# Patient Record
Sex: Male | Born: 1945 | Race: White | Hispanic: No | Smoking: Never smoker
Health system: Southern US, Community
[De-identification: ages and names within clinical notes are randomized; demographics above are authoritative.]

## PROBLEM LIST (undated history)

## (undated) DIAGNOSIS — F0390 Unspecified dementia without behavioral disturbance: Secondary | ICD-10-CM

## (undated) DIAGNOSIS — I10 Essential (primary) hypertension: Secondary | ICD-10-CM

## (undated) HISTORY — PX: NO PAST SURGERIES: SHX2092

---

## 2021-04-08 ENCOUNTER — Emergency Department: Payer: Medicare Other

## 2021-04-08 ENCOUNTER — Other Ambulatory Visit: Payer: Self-pay

## 2021-04-08 ENCOUNTER — Inpatient Hospital Stay
Admission: EM | Admit: 2021-04-08 | Discharge: 2021-04-20 | DRG: 689 | Disposition: A | Discharge status: Skilled Nursing Facility | Payer: Medicare Other | Source: Skilled Nursing Facility | Attending: Internal Medicine | Admitting: Internal Medicine

## 2021-04-08 DIAGNOSIS — U071 COVID-19: Principal | ICD-10-CM | POA: Diagnosis present

## 2021-04-08 DIAGNOSIS — Z9181 History of falling: Secondary | ICD-10-CM

## 2021-04-08 DIAGNOSIS — Z20822 Contact with and (suspected) exposure to covid-19: Secondary | ICD-10-CM | POA: Diagnosis present

## 2021-04-08 DIAGNOSIS — G3183 Dementia with Lewy bodies: Secondary | ICD-10-CM | POA: Diagnosis present

## 2021-04-08 DIAGNOSIS — I4891 Unspecified atrial fibrillation: Secondary | ICD-10-CM | POA: Diagnosis present

## 2021-04-08 DIAGNOSIS — G20A1 Parkinson's disease without dyskinesia, without mention of fluctuations: Secondary | ICD-10-CM | POA: Diagnosis present

## 2021-04-08 DIAGNOSIS — B964 Proteus (mirabilis) (morganii) as the cause of diseases classified elsewhere: Secondary | ICD-10-CM | POA: Diagnosis present

## 2021-04-08 DIAGNOSIS — I10 Essential (primary) hypertension: Secondary | ICD-10-CM | POA: Diagnosis present

## 2021-04-08 DIAGNOSIS — R531 Weakness: Secondary | ICD-10-CM | POA: Diagnosis present

## 2021-04-08 DIAGNOSIS — G20 Parkinson's disease: Secondary | ICD-10-CM | POA: Diagnosis present

## 2021-04-08 DIAGNOSIS — K5900 Constipation, unspecified: Secondary | ICD-10-CM | POA: Diagnosis present

## 2021-04-08 DIAGNOSIS — Z79899 Other long term (current) drug therapy: Secondary | ICD-10-CM

## 2021-04-08 DIAGNOSIS — N3000 Acute cystitis without hematuria: Secondary | ICD-10-CM

## 2021-04-08 DIAGNOSIS — F0280 Dementia in other diseases classified elsewhere without behavioral disturbance: Secondary | ICD-10-CM | POA: Diagnosis present

## 2021-04-08 DIAGNOSIS — N390 Urinary tract infection, site not specified: Principal | ICD-10-CM | POA: Diagnosis present

## 2021-04-08 HISTORY — DX: Essential (primary) hypertension: I10

## 2021-04-08 HISTORY — DX: Unspecified dementia, unspecified severity, without behavioral disturbance, psychotic disturbance, mood disturbance, and anxiety: F03.90

## 2021-04-08 LAB — URINALYSIS, COMPLETE (UACMP) WITH MICROSCOPIC
Bilirubin Urine: NEGATIVE
Glucose, UA: NEGATIVE mg/dL
Ketones, ur: 20 mg/dL — AB
Nitrite: POSITIVE — AB
Protein, ur: NEGATIVE mg/dL
RBC / HPF: >50 RBC/hpf — ABNORMAL HIGH (ref 0–5)
Specific Gravity, Urine: 1.016 (ref 1.005–1.030)
Squamous Epithelial / HPF: NONE SEEN (ref 0–5)
WBC, UA: >50 WBC/hpf — ABNORMAL HIGH (ref 0–5)
pH: 8 (ref 5.0–8.0)

## 2021-04-08 LAB — COMPREHENSIVE METABOLIC PANEL
ALT: 13 U/L (ref 0–44)
AST: 18 U/L (ref 15–41)
Albumin: 4.2 g/dL (ref 3.5–5.0)
Alkaline Phosphatase: 93 U/L (ref 38–126)
Anion gap: 9 (ref 5–15)
BUN: 17 mg/dL (ref 8–23)
CO2: 24 mmol/L (ref 22–32)
Calcium: 9.1 mg/dL (ref 8.9–10.3)
Chloride: 108 mmol/L (ref 98–111)
Creatinine, Ser: 0.98 mg/dL (ref 0.61–1.24)
GFR, Estimated: >60 mL/min (ref >60)
Glucose, Bld: 99 mg/dL (ref 70–99)
Potassium: 3.9 mmol/L (ref 3.5–5.1)
Sodium: 141 mmol/L (ref 135–145)
Total Bilirubin: 1.5 mg/dL — ABNORMAL HIGH (ref 0.3–1.2)
Total Protein: 7.1 g/dL (ref 6.5–8.1)

## 2021-04-08 LAB — RESP PANEL BY RT-PCR (FLU A&B, COVID) ARPGX2
Influenza A by PCR: NEGATIVE
Influenza B by PCR: NEGATIVE
SARS Coronavirus 2 by RT PCR: POSITIVE — AB

## 2021-04-08 LAB — CBC WITH DIFFERENTIAL/PLATELET
Abs Immature Granulocytes: 0.02 10*3/uL (ref 0.00–0.07)
Basophils Absolute: 0 10*3/uL (ref 0.0–0.1)
Basophils Relative: 1 %
Eosinophils Absolute: 0.2 10*3/uL (ref 0.0–0.5)
Eosinophils Relative: 2 %
HCT: 44.2 % (ref 39.0–52.0)
Hemoglobin: 15.4 g/dL (ref 13.0–17.0)
Immature Granulocytes: 0 %
Lymphocytes Relative: 7 %
Lymphs Abs: 0.6 10*3/uL — ABNORMAL LOW (ref 0.7–4.0)
MCH: 32.4 pg (ref 26.0–34.0)
MCHC: 34.8 g/dL (ref 30.0–36.0)
MCV: 93.1 fL (ref 80.0–100.0)
Monocytes Absolute: 0.7 10*3/uL (ref 0.1–1.0)
Monocytes Relative: 8 %
Neutro Abs: 6.6 10*3/uL (ref 1.7–7.7)
Neutrophils Relative %: 82 %
Platelets: 160 10*3/uL (ref 150–400)
RBC: 4.75 MIL/uL (ref 4.22–5.81)
RDW: 12.6 % (ref 11.5–15.5)
WBC: 8 10*3/uL (ref 4.0–10.5)
nRBC: 0 % (ref 0.0–0.2)

## 2021-04-08 LAB — MRSA PCR SCREENING: MRSA by PCR: NEGATIVE

## 2021-04-08 LAB — TROPONIN I (HIGH SENSITIVITY): Troponin I (High Sensitivity): 7 ng/L (ref <18)

## 2021-04-08 MED ORDER — POLYVINYL ALCOHOL 1.4 % OP SOLN
1.0000 [drp] | Freq: Two times a day (BID) | OPHTHALMIC | Status: DC
  Administered 2021-04-08 – 2021-04-20 (×24): 1 [drp] via OPHTHALMIC
  Filled 2021-04-08: qty 15

## 2021-04-08 MED ORDER — SODIUM CHLORIDE 0.9 % IV SOLN
1.0000 g | INTRAVENOUS | Status: DC
  Administered 2021-04-09 – 2021-04-11 (×3): 1 g via INTRAVENOUS
  Filled 2021-04-08: qty 1
  Filled 2021-04-08 (×3): qty 10

## 2021-04-08 MED ORDER — ACETAMINOPHEN 325 MG PO TABS: 650.0000 mg | ORAL_TABLET | Freq: Four times a day (QID) | ORAL | Status: DC | PRN

## 2021-04-08 MED ORDER — ONDANSETRON HCL 4 MG PO TABS: 4.0000 mg | ORAL_TABLET | Freq: Four times a day (QID) | ORAL | Status: DC | PRN

## 2021-04-08 MED ORDER — ESCITALOPRAM OXALATE 10 MG PO TABS
20.0000 mg | ORAL_TABLET | Freq: Every day | ORAL | Status: DC
  Administered 2021-04-09 – 2021-04-20 (×12): 20 mg via ORAL
  Filled 2021-04-08 (×12): qty 2

## 2021-04-08 MED ORDER — SODIUM CHLORIDE 0.9 % IV BOLUS
500.0000 mL | Freq: Once | INTRAVENOUS | Status: AC
  Administered 2021-04-08: 500 mL via INTRAVENOUS

## 2021-04-08 MED ORDER — SODIUM CHLORIDE 0.9% FLUSH: 3.0000 mL | INTRAVENOUS | Status: DC | PRN

## 2021-04-08 MED ORDER — VITAMIN D (ERGOCALCIFEROL) 1.25 MG (50000 UNIT) PO CAPS
50000.0000 [IU] | ORAL_CAPSULE | ORAL | Status: DC
  Administered 2021-04-09 – 2021-04-16 (×2): 50000 [IU] via ORAL
  Filled 2021-04-08 (×2): qty 1

## 2021-04-08 MED ORDER — SODIUM CHLORIDE 0.9 % IV SOLN: 250.0000 mL | INTRAVENOUS | Status: DC | PRN

## 2021-04-08 MED ORDER — MIRTAZAPINE 15 MG PO TABS
30.0000 mg | ORAL_TABLET | Freq: Every day | ORAL | Status: DC
  Administered 2021-04-09 – 2021-04-19 (×11): 30 mg via ORAL
  Filled 2021-04-08 (×11): qty 2

## 2021-04-08 MED ORDER — SODIUM CHLORIDE 0.9 % IV SOLN
1.0000 g | Freq: Once | INTRAVENOUS | Status: AC
  Administered 2021-04-08: 1 g via INTRAVENOUS
  Filled 2021-04-08: qty 10

## 2021-04-08 MED ORDER — SODIUM CHLORIDE 0.9% FLUSH
3.0000 mL | Freq: Two times a day (BID) | INTRAVENOUS | Status: DC
  Administered 2021-04-08 – 2021-04-20 (×24): 3 mL via INTRAVENOUS

## 2021-04-08 MED ORDER — ONDANSETRON HCL 4 MG/2ML IJ SOLN: 4.0000 mg | Freq: Four times a day (QID) | INTRAMUSCULAR | Status: DC | PRN

## 2021-04-08 MED ORDER — CARBIDOPA-LEVODOPA 25-100 MG PO TABS
1.0000 | ORAL_TABLET | Freq: Three times a day (TID) | ORAL | Status: DC
  Administered 2021-04-08 – 2021-04-20 (×36): 1 via ORAL
  Filled 2021-04-08 (×36): qty 1

## 2021-04-08 MED ORDER — ENOXAPARIN SODIUM 40 MG/0.4ML ~~LOC~~ SOLN
40.0000 mg | SUBCUTANEOUS | Status: DC
  Administered 2021-04-08 – 2021-04-20 (×13): 40 mg via SUBCUTANEOUS
  Filled 2021-04-08 (×13): qty 0.4

## 2021-04-08 MED ORDER — RIVASTIGMINE 9.5 MG/24HR TD PT24
9.5000 mg | MEDICATED_PATCH | Freq: Every day | TRANSDERMAL | Status: DC
  Administered 2021-04-09 – 2021-04-20 (×12): 9.5 mg via TRANSDERMAL
  Filled 2021-04-08 (×12): qty 1

## 2021-04-08 MED ORDER — DILTIAZEM HCL ER COATED BEADS 120 MG PO CP24
120.0000 mg | ORAL_CAPSULE | Freq: Every day | ORAL | Status: DC
  Administered 2021-04-09 – 2021-04-10 (×2): 120 mg via ORAL
  Filled 2021-04-08 (×2): qty 1

## 2021-04-08 MED ORDER — ACETAMINOPHEN 650 MG RE SUPP: 650.0000 mg | Freq: Four times a day (QID) | RECTAL | Status: DC | PRN

## 2021-04-08 NOTE — ED Triage Notes (Addendum)
Pt to ED [facility] from [facility] for AMS x2 days.  Hx parkinsons, HTN Pt reports having fall, unsure of details, EMS did not receive info of fall with report.  Pt alert and oriented on arrival.  Hx of UTI Wheelchair use at home  MD Fuller Plan at bedside

## 2021-04-08 NOTE — H&P (Signed)
History and Physical    Justin HaroldHarold Holaday WUJ:[id number] DOB: 12/26/1946 DOA: 04/08/2021  PCP: Pcp, No   Patient coming from: [facility] assisted living facility  I have personally briefly reviewed patient's [facility] Most of the history was obtained from the ER notes and patient's brother-in-law at the bedside  Chief Complaint: Weakness  HPI: Justin Kim is a 75 y.o. male with medical history significant for Parkinson's disease with Lewy body dementia and hypertension who was brought to the ER by EMS for evaluation of weakness and disorientation for about 2 days.  Patient usually ambulates with a rolling walker has been too weak to do that.  No history of recent falls.  His brother-in-law was at the bedside states that he has difficulty with ambulation due to his Parkinson's disease. He denies having any cough, no fever, no chills, no diarrhea, no myalgias, no anorexia, no urinary frequency, no nocturia, no dysuria, no headache, no chest pain or shortness of breath, no palpitations, no diaphoresis, no nausea or vomiting. Labs show sodium 141, potassium 3.9, chloride 108, bicarb 24, glucose 99, BUN 17, creatinine 0.98, calcium 9.9, alkaline phosphatase 93, albumin 4.2, AST 18, ALT 13, total protein 7.1, total bilirubin 1.5, troponin VII, white count 8.0 with a left shift, hemoglobin 15.4, hematocrit 44.2, MCV 93.1, RDW 12.6, platelet count 160 His SARS coronavirus 2 point-of-care PCR test is positive. Urinalysis shows pyuria Cervical spine CT shows early degenerative changes in the cervical spine. Mild osteopenia. No acute bony abnormality. CT scan of the head without contrast shows atrophy, chronic microvascular disease. No acute intracranial abnormality. Chronic sinusitis. Twelve-lead EKG reviewed by me shows atrial fibrillation with nonspecific ST changes in the anterior lateral leads.    ED Course: Patient is a 75 year old male who presents to the ER by EMS for  evaluation of weakness and difficulty with ambulation.  Patient's point-of-care SARS coronavirus 2 PCR test is positive but he is asymptomatic.  He is vaccinated and resides in an assisted facility.  He has significant pyuria and will be admitted to the hospital for further evaluation.    Review of Systems: As per HPI otherwise all other systems reviewed and negative.    Past Medical History:  Diagnosis Date  . Dementia (HCC)   . Hypertension       reports previous alcohol use. He reports previous drug use. No history on file for tobacco use.  Not on File  Family History  Family history unknown: Yes      Prior to Admission medications   Medication Sig Start Date End Date Taking? Authorizing Provider  carbidopa-levodopa (SINEMET IR) 25-100 MG tablet Take 1 tablet by mouth 3 (three) times daily. 03/13/21  Yes [provider]  carboxymethylcellulose 1 % ophthalmic solution 1 drop 2 (two) times daily.   Yes [provider]  diltiazem (CARDIZEM CD) 120 MG 24 hr capsule Take 120 mg by mouth daily. 03/13/21  Yes [provider]  escitalopram (LEXAPRO) 20 MG tablet Take 1 tablet by mouth daily. 01/17/21  Yes [provider]  mirtazapine (REMERON) 30 MG tablet Take 30 mg by mouth at bedtime. 03/13/21  Yes [provider]  rivastigmine (EXELON) 9.5 mg/24hr 9.5 mg daily. 03/16/21  Yes [provider]  Vitamin D, Ergocalciferol, (DRISDOL) 1.25 MG (50000 UNIT) CAPS capsule Take 1 capsule by mouth once a week. 03/13/21   [provider]    Physical Exam: Vitals:   04/08/21 1025 04/08/21 1030 04/08/21 1130 04/08/21  1145  BP:  (!) 131/93    Pulse: (!) 114 95 99 (!) 102  Resp: 15 13 13  (!) 24  Temp:      TempSrc:      SpO2: 100% 100% 100% 98%  Weight:      Height:         Vitals:   04/08/21 1025 04/08/21 1030 04/08/21 1130 04/08/21 1145  BP:  (!) 131/93    Pulse: (!) 114 95 99 (!) 102  Resp: 15 13 13  (!) 24  Temp:       TempSrc:      SpO2: 100% 100% 100% 98%  Weight:      Height:          Constitutional: Alert and oriented x 1.  Only to person not to place or time . Not in any apparent distress HEENT:      Head: Normocephalic and atraumatic.         Eyes: PERLA, EOMI, Conjunctivae are normal. Sclera is non-icteric.       Mouth/Throat: Mucous membranes are moist.       Neck: Supple with no signs of meningismus. Cardiovascular:  Irregularly irregular. No murmurs, gallops, or rubs. 2+ symmetrical distal pulses are present . No JVD. No LE edema Respiratory: Respiratory effort normal .Lungs sounds clear bilaterally. No wheezes, crackles, or rhonchi.  Gastrointestinal: Soft, non tender, and non distended with positive bowel sounds.  Genitourinary: No CVA tenderness. Musculoskeletal: Nontender with normal range of motion in all extremities. No cyanosis, or erythema of extremities. Neurologic:  Face is symmetric. Moving all extremities. No gross focal neurologic deficits.  Generalized tremors Skin: Skin is warm, dry.  No rash or ulcers Psychiatric: Mood and affect are normal   Labs on Admission: I have personally reviewed following labs and imaging studies  CBC: Recent Labs  Lab 04/08/21 0817  WBC 8.0  NEUTROABS 6.6  HGB 15.4  HCT 44.2  MCV 93.1  PLT 160   Basic Metabolic Panel: Recent Labs  Lab 04/08/21 0817  NA 141  K 3.9  CL 108  CO2 24  GLUCOSE 99  BUN 17  CREATININE 0.98  CALCIUM 9.1   GFR: Estimated Creatinine Clearance: 69.4 mL/min (by C-G formula based on SCr of 0.98 mg/dL). Liver Function Tests: Recent Labs  Lab 04/08/21 0817  AST 18  ALT 13  ALKPHOS 93  BILITOT 1.5*  PROT 7.1  ALBUMIN 4.2   No results for input(s): LIPASE, AMYLASE in the last 168 hours. No results for input(s): AMMONIA in the last 168 hours. Coagulation Profile: No results for input(s): INR, PROTIME in the last 168 hours. Cardiac Enzymes: No results for input(s): CKTOTAL, CKMB, CKMBINDEX,  TROPONINI in the last 168 hours. BNP (last 3 results) No results for input(s): PROBNP in the last 8760 hours. HbA1C: No results for input(s): HGBA1C in the last 72 hours. CBG: No results for input(s): GLUCAP in the last 168 hours. Lipid Profile: No results for input(s): CHOL, HDL, LDLCALC, TRIG, CHOLHDL, LDLDIRECT in the last 72 hours. Thyroid Function Tests: No results for input(s): TSH, T4TOTAL, FREET4, T3FREE, THYROIDAB in the last 72 hours. Anemia Panel: No results for input(s): VITAMINB12, FOLATE, FERRITIN, TIBC, IRON, RETICCTPCT in the last 72 hours. Urine analysis:    Component Value Date/Time   COLORURINE YELLOW (A) 04/08/2021 0818   APPEARANCEUR CLOUDY (A) 04/08/2021 0818   LABSPEC 1.016 04/08/2021 0818   PHURINE 8.0 04/08/2021 0818   GLUCOSEU NEGATIVE 04/08/2021 0818   HGBUR MODERATE (A)  04/08/2021 0818   BILIRUBINUR NEGATIVE 04/08/2021 0818   KETONESUR 20 (A) 04/08/2021 0818   PROTEINUR NEGATIVE 04/08/2021 0818   NITRITE POSITIVE (A) 04/08/2021 0818   LEUKOCYTESUR LARGE (A) 04/08/2021 0818    Radiological Exams on Admission: DG Chest 2 View  Result Date: 04/08/2021 CLINICAL DATA:  Pt to ED [facility] from [facility] for AMS x2 days. Hx parkinsons, HTNPt reports having fall, unsure of details, EMS did not receive info of fall with report. Pt alert and oriented on arrival. Hx of UTIWheelchair use at home EXAM: CHEST - 2 VIEW COMPARISON:  None. FINDINGS: Cardiac silhouette top-normal in size. No mediastinal or hilar masses. No evidence of adenopathy. Clear lungs.  No pleural effusion or pneumothorax. Skeletal structures are demineralized but intact. IMPRESSION: No active cardiopulmonary disease. Electronically Signed   By: Amie Portland M.D.   On: 04/08/2021 08:39   CT Head Wo Contrast  Result Date: 04/08/2021 CLINICAL DATA:  Altered mental status.  Fall. EXAM: CT HEAD WITHOUT CONTRAST TECHNIQUE: Contiguous axial images were obtained from the base of the skull through the  vertex without intravenous contrast. COMPARISON:  None. FINDINGS: Brain: There is atrophy and chronic small vessel disease changes. No acute intracranial abnormality. Specifically, no hemorrhage, hydrocephalus, mass lesion, acute infarction, or significant intracranial injury. Vascular: No hyperdense vessel or unexpected calcification. Skull: No acute calvarial abnormality. Sinuses/Orbits: Mucosal thickening in scattered ethmoid air cells and the left maxillary sinus. No air-fluid levels. Other: None IMPRESSION: Atrophy, chronic microvascular disease. No acute intracranial abnormality. Chronic sinusitis. Electronically Signed   By: Charlett Nose M.D.   On: 04/08/2021 08:56   CT Cervical Spine Wo Contrast  Result Date: 04/08/2021 CLINICAL DATA:  Altered mental status.  Fall. EXAM: CT CERVICAL SPINE WITHOUT CONTRAST TECHNIQUE: Multidetector CT imaging of the cervical spine was performed without intravenous contrast. Multiplanar CT image reconstructions were also generated. COMPARISON:  None. FINDINGS: Alignment: No subluxation. Skull base and vertebrae: No acute fracture.  Mild osteopenia. Soft tissues and spinal canal: No prevertebral fluid or swelling. No visible canal hematoma. Disc levels: Early degenerative disc disease in the mid to lower cervical spine with early disc space narrowing and anterior spurring. Mild bilateral degenerative facet disease. Upper chest: No acute findings. Other: None IMPRESSION: Early degenerative changes in the cervical spine. Mild osteopenia. No acute bony abnormality. Electronically Signed   By: Charlett Nose M.D.   On: 04/08/2021 08:58     Assessment/Plan Principal Problem:   Generalized weakness Active Problems:   Acute lower UTI   Dementia due to Parkinson's disease without behavioral disturbance (HCC)   Parkinson disease (HCC)   Unspecified atrial fibrillation (HCC)   COVID-19 virus detected     Generalized weakness Appears to be multifactorial and related to  Parkinson's disease as well as recent UTI. We will place patient on fall precautions PT evaluation    UTI Patient has significant pyuria We will treat empirically with Rocephin 1 g IV daily until urine culture results become available    Parkinson's disease with dementia without behavioral disturbance Continue Sinemet Continue Exelon and Remeron    History of A. Fib Continue diltiazem for rate control Patient is noted anticoagulation due to increased risk of falls    COVID-19 virus detected Patient had a screening test on admission and tested positive for COVID-19 virus He is vaccinated He is currently asymptomatic We will monitor closely during this hospitalization   DVT prophylaxis: Lovenox Code Status: full code Family Communication: Greater than 50% of time  was spent discussing patient's condition and plan of care with his brother-in-law at the bedside.  All questions and concerns have been addressed.  He verbalizes understanding and agrees to the plan. Disposition Plan: Back to previous home environment Consults called: Physical therapy Status: At time of admission, it appears that the appropriate admission status for this patient is inpatient. This is judged to be reasonable and necessary in order to provide the required intensity of service to ensure the patient's safety given the presenting symptoms, physical exam findings and initial radiographic and laboratory data in the context of their comorbid conditions. Patient requires inpatient status due to high intensity of service, high risk for further deterioration and high frequency of surveillance required.    Lucile Shutters MD [facility]     04/08/2021, 12:37 PM

## 2021-04-08 NOTE — ED Notes (Signed)
Spoke to [place] at [facility], informed of pt covid +.  Reports that pt usually uses walker but was unable to do so today d/t weakness and disorientation

## 2021-04-08 NOTE — ED Notes (Signed)
Pt to CT

## 2021-04-08 NOTE — ED Notes (Signed)
Dr Fuller Plan notified of positive covid. Orders to be placed as needed.

## 2021-04-08 NOTE — Evaluation (Signed)
Physical Therapy Evaluation Patient Details Name: Justin Kim MRN: [id number] DOB: 1946/01/28 Today's Date: 04/08/2021   History of Present Illness  Pt admitted for generalized weakness with AMS x 2 days. History includes Parkinson's dx, HTN, and lewy body dementia.  Clinical Impression  Pt is a pleasant 75 year old male who was admitted for generalized weakness and now is covid +. Pt performs bed mobility with max assist. Needed to place bed in trendelenburg position for repositioning once in bed. Two attempts to stand with poor weight shift and max assist. Unable to stand with or without AD. Pt demonstrates deficits with cognition/mobility/strength. Per EMR, baseline is minimal ambulation/transfers to WC. DOesn't appear to be at baseline level at this time. Would benefit from skilled PT to address above deficits and promote optimal return to PLOF; recommend transition to STR upon discharge from acute hospitalization.     Follow Up Recommendations SNF    Equipment Recommendations  None recommended by PT    Recommendations for Other Services       Precautions / Restrictions Precautions Precautions: Fall Restrictions Weight Bearing Restrictions: No      Mobility  Bed Mobility Overal bed mobility: Needs Assistance Bed Mobility: Supine to Sit     Supine to sit: Max assist     General bed mobility comments: needs max assist to sit at EOB, with post leaning noted. Once seated at EOB, L lateral leaning needing min assist for correction. Able to sit for extended time and perform ther-ex    Transfers                 General transfer comment: attempted x 2 with and without RW. Unable to demo ant weight shift and is resistive to manual cues. Unable to lift buttocks  Ambulation/Gait             General Gait Details: not able to  perform  Stairs            Wheelchair Mobility    Modified Rankin (Stroke Patients Only)       Balance Overall balance  assessment: Needs assistance Sitting-balance support: Feet supported;Bilateral upper extremity supported Sitting balance-Leahy Scale: Fair Sitting balance - Comments: has difficulty maintaining upright posture. L lateral leaning noted                                     Pertinent Vitals/Pain Pain Assessment: No/denies pain    Home Living Family/patient expects to be discharged to:: Assisted living               Home Equipment: Walker - 2 wheels;Wheelchair - manual Additional Comments: lives at [facility]    Prior Function Level of Independence: Needs assistance         Comments: pt is poor historian. Reports he transfers with RW to [facility] with staff assistance and then self propels. Unsure of accuracy     Hand Dominance        Extremity/Trunk Assessment   Upper Extremity Assessment Upper Extremity Assessment: Generalized weakness (B UE grossly 3+/5 and limited ROM overhead)    Lower Extremity Assessment Lower Extremity Assessment: Generalized weakness;Difficult to assess due to impaired cognition (B LE grossly 2/5; resistive to movement)       Communication   Communication: No difficulties  Cognition Arousal/Alertness: Awake/alert Behavior During Therapy: Flat affect Overall Cognitive Status: History of cognitive impairments - at baseline  General Comments: pt alert and oriented to self. Pleasant. Takes increased time for task initiation      General Comments      Exercises Other Exercises Other Exercises: supine/seated ther-ex performed on B LE including AP, SLRs, hip abd/add, and LAQ. All ther-ex performed x 10 reps with mod assist and cues for task intiiation. Delayed response   Assessment/Plan    PT Assessment Patient needs continued PT services  PT Problem List Decreased strength;Decreased activity tolerance;Decreased balance;Decreased mobility;Decreased cognition       PT  Treatment Interventions Gait training;DME instruction;Therapeutic exercise;Balance training    PT Goals (Current goals can be found in the Care Plan section)  Acute Rehab PT Goals Patient Stated Goal: unable to state PT Goal Formulation: Patient unable to participate in goal setting Time For Goal Achievement: 04/22/21 Potential to Achieve Goals: Fair    Frequency Min 2X/week   Barriers to discharge        Co-evaluation               AM-PAC PT "6 Clicks" Mobility  Outcome Measure Help needed turning from your back to your side while in a flat bed without using bedrails?: A Lot Help needed moving from lying on your back to sitting on the side of a flat bed without using bedrails?: A Lot Help needed moving to and from a bed to a chair (including a wheelchair)?: Total Help needed standing up from a chair using your arms (e.g., wheelchair or bedside chair)?: Total Help needed to walk in hospital room?: Total Help needed climbing 3-5 steps with a railing? : Total 6 Click Score: 8    End of Session Equipment Utilized During Treatment: Gait belt Activity Tolerance: Patient tolerated treatment well Patient left: in bed;with bed alarm set Nurse Communication: Mobility status PT Visit Diagnosis: Muscle weakness (generalized) (M62.81);Difficulty in walking, not elsewhere classified (R26.2)    Time: 8250-0370 PT Time Calculation (min) (ACUTE ONLY): 27 min   Charges:   PT Evaluation $PT Eval Low Complexity: 1 Low PT Treatments $Therapeutic Exercise: 8-22 mins        Elizabeth Palau, PT, DPT [phone]   Kira Hartl 04/08/2021, 4:33 PM

## 2021-04-08 NOTE — ED Notes (Signed)
This tech and Caitlyn EDT attempted to ambulate pt with a walker. Pt was able to sit up on the side of the bed with assistance. Pt was unable to use walker to stand up and ambulate. Fuller Plan, MD is aware.

## 2021-04-08 NOTE — ED Notes (Signed)
This RN attempted to get in contact with [facility] to get baseline on pt, no answer

## 2021-04-08 NOTE — ED Provider Notes (Signed)
[facility] Emergency Department Provider Note  ____________________________________________   Event Date/Time   First MD Initiated Contact with Patient 04/08/21 0815     (approximate)  I have reviewed the triage vital signs and the nursing notes.   HISTORY  Chief Complaint Altered Mental Status    HPI Justin Kim is a 75 y.o. male with Parkinson's and hypertension who comes in for altered mental status.  Patient is coming from [facility] assisted living.  Patient has never been to our hospital previously.  Patient reports that he had a fall today.  Unclear if he hit his head.  Denies any pain anywhere.  According to EMS they were never told that patient had a fall.  They were told patient was coming in for altered mental status.  They were stating that he was just more weak than normal and he was not answering questions like he typically does.  Nurse was able to get a hold of the facility when patient first got here and they stated that patient normally walks with a walker but they were having difficulty eating standing him up today.  Patient's history is limited secondary to patient's baseline Parkinson's disease.      medical: Parkinson's, hypertension   There are no problems to display for this patient.  Prior to Admission medications   Not on File    Allergies Patient has no allergy information on record.  No family history on file.  Social History No alcohol or drug use   Review of Systems Constitutional: No fever/chills, weakness Eyes: No visual changes. ENT: No sore throat. Cardiovascular: Denies chest pain. Respiratory: Denies shortness of breath. Gastrointestinal: No abdominal pain.  No nausea, no vomiting.  No diarrhea.  No constipation. Genitourinary: Negative for dysuria. Musculoskeletal: Negative for back pain. Skin: Negative for rash. Neurological: Negative for headaches, focal weakness or numbness.  Confusion All other  ROS negative ____________________________________________   PHYSICAL EXAM:  VITAL SIGNS: Blood pressure (!) 131/93, pulse (!) 102, temperature 98.9 F (37.2 C), temperature source Oral, resp. rate (!) 24, height 5\' 11"  (1.803 m), weight 81.6 kg, SpO2 98 %.   Constitutional: Alert and oriented x3. Well appearing and in no acute distress. Eyes: Conjunctivae are normal. EOMI. Head: Atraumatic. Nose: No congestion/rhinnorhea. Mouth/Throat: Mucous membranes are moist.   Neck: No stridor. Trachea Midline. FROM Cardiovascular: Irregular, tachycardic grossly normal heart sounds.  Good peripheral circulation. Respiratory: Normal respiratory effort.  No retractions. Lungs CTAB. Gastrointestinal: Soft and nontender. No distention. No abdominal bruits.  Musculoskeletal: No lower extremity tenderness nor edema.  No joint effusions.  No evidence of tenderness in any of his extremities. Neurologic:  Normal speech and language. No gross focal neurologic deficits are appreciated.  Equal strength in arms and legs. Skin:  Skin is warm, dry and intact. No rash noted. Psychiatric: Mood and affect are normal. Speech and behavior are normal. GU: Deferred   ____________________________________________   LABS (all labs ordered are listed, but only abnormal results are displayed)  Labs Reviewed  RESP PANEL BY RT-PCR (FLU A&B, COVID) ARPGX2 - Abnormal; Notable for the following components:      Result Value   SARS Coronavirus 2 by RT PCR POSITIVE (*)    All other components within normal limits  CBC WITH DIFFERENTIAL/PLATELET - Abnormal; Notable for the following components:   Lymphs Abs 0.6 (*)    All other components within normal limits  COMPREHENSIVE METABOLIC PANEL - Abnormal; Notable for the following components:  Total Bilirubin 1.5 (*)    All other components within normal limits  URINALYSIS, COMPLETE (UACMP) WITH MICROSCOPIC - Abnormal; Notable for the following components:   Color, Urine  YELLOW (*)    APPearance CLOUDY (*)    Hgb urine dipstick MODERATE (*)    Ketones, ur 20 (*)    Nitrite POSITIVE (*)    Leukocytes,Ua LARGE (*)    RBC / HPF >50 (*)    WBC, UA >50 (*)    Bacteria, UA MANY (*)    All other components within normal limits  URINE CULTURE  TROPONIN I (HIGH SENSITIVITY)   ____________________________________________   ED ECG REPORT I, Concha Se, the attending physician, personally viewed and interpreted this ECG.  Atrial fibrillation rate of 107, no ST elevation, no T wave inversions, normal intervals ____________________________________________  RADIOLOGY Vela Prose, personally viewed and evaluated these images (plain radiographs) as part of my medical decision making, as well as reviewing the written report by the radiologist.  ED MD interpretation: No pneumonia  Official radiology report(s): DG Chest 2 View  Result Date: 04/08/2021 CLINICAL DATA:  Pt to ED [facility] from [facility] for AMS x2 days. Hx parkinsons, HTNPt reports having fall, unsure of details, EMS did not receive info of fall with report. Pt alert and oriented on arrival. Hx of UTIWheelchair use at home EXAM: CHEST - 2 VIEW COMPARISON:  None. FINDINGS: Cardiac silhouette top-normal in size. No mediastinal or hilar masses. No evidence of adenopathy. Clear lungs.  No pleural effusion or pneumothorax. Skeletal structures are demineralized but intact. IMPRESSION: No active cardiopulmonary disease. Electronically Signed   By: Amie Portland M.D.   On: 04/08/2021 08:39   CT Head Wo Contrast  Result Date: 04/08/2021 CLINICAL DATA:  Altered mental status.  Fall. EXAM: CT HEAD WITHOUT CONTRAST TECHNIQUE: Contiguous axial images were obtained from the base of the skull through the vertex without intravenous contrast. COMPARISON:  None. FINDINGS: Brain: There is atrophy and chronic small vessel disease changes. No acute intracranial abnormality. Specifically, no hemorrhage, hydrocephalus,  mass lesion, acute infarction, or significant intracranial injury. Vascular: No hyperdense vessel or unexpected calcification. Skull: No acute calvarial abnormality. Sinuses/Orbits: Mucosal thickening in scattered ethmoid air cells and the left maxillary sinus. No air-fluid levels. Other: None IMPRESSION: Atrophy, chronic microvascular disease. No acute intracranial abnormality. Chronic sinusitis. Electronically Signed   By: Charlett Nose M.D.   On: 04/08/2021 08:56   CT Cervical Spine Wo Contrast  Result Date: 04/08/2021 CLINICAL DATA:  Altered mental status.  Fall. EXAM: CT CERVICAL SPINE WITHOUT CONTRAST TECHNIQUE: Multidetector CT imaging of the cervical spine was performed without intravenous contrast. Multiplanar CT image reconstructions were also generated. COMPARISON:  None. FINDINGS: Alignment: No subluxation. Skull base and vertebrae: No acute fracture.  Mild osteopenia. Soft tissues and spinal canal: No prevertebral fluid or swelling. No visible canal hematoma. Disc levels: Early degenerative disc disease in the mid to lower cervical spine with early disc space narrowing and anterior spurring. Mild bilateral degenerative facet disease. Upper chest: No acute findings. Other: None IMPRESSION: Early degenerative changes in the cervical spine. Mild osteopenia. No acute bony abnormality. Electronically Signed   By: Charlett Nose M.D.   On: 04/08/2021 08:58    ____________________________________________   PROCEDURES  Procedure(s) performed (including Critical Care):  .1-3 Lead EKG Interpretation Performed by: Concha Se, MD Authorized by: Concha Se, MD     Interpretation: abnormal     ECG rate:  90-110s  ECG rate assessment: tachycardic     Rhythm: atrial fibrillation     Ectopy: none     Conduction: normal       ____________________________________________   INITIAL IMPRESSION / ASSESSMENT AND PLAN / ED COURSE  Justin Kim was evaluated in Emergency Department on  04/08/2021 for the symptoms described in the history of present illness. He was evaluated in the context of the global COVID-19 pandemic, which necessitated consideration that the patient might be at risk for infection with the SARS-CoV-2 virus that causes COVID-19. Institutional protocols and algorithms that pertain to the evaluation of patients at risk for COVID-19 are in a state of rapid change based on information released by regulatory bodies including the CDC and federal and state organizations. These policies and algorithms were followed during the patient's care in the ED.     Patient comes in with some confusion and weakness per staff.  Will get urine to evaluate for UTI, labs to evaluate for Electra MIs, AKI, ACS, Covid swab.  Patient reports having a fall. Will get a CT head and CT cervical to make sure no evidence of intracranial hemorrhage or cervical fracture.  Patient is in atrial fibrillation with rates less than 110 therefore is rate controlled but will give a little bit of fluid.    Patient's Covid swab is positive.  Urine is also positive for UTI.  Will send urine culture and give ceftriaxone.  Nurses attempted to get patient up and he was very weak.  Discussed with patient's son-in-law for admission given new weakness and I concerned that [facility] will not be able to take care of him given he is in an assisted living.  Initially son-in-law was hesitant of patient being admitted.  I made multiple attempts to try to get a hold of [facility] again to see if they felt comfortable with him going home even though he is now pretty much bedbound but we have not been able to get a hold of them.  Family has decided that they are okay with admitting patient due to new weakness   ____________________________________________   FINAL CLINICAL IMPRESSION(S) / ED DIAGNOSES   Final diagnoses:  COVID-19 virus detected  Acute cystitis without hematuria  Weakness  Atrial fibrillation,  unspecified type (HCC)      MEDICATIONS GIVEN DURING THIS VISIT:  Medications  sodium chloride 0.9 % bolus 500 mL (0 mLs Intravenous Stopped 04/08/21 1008)  cefTRIAXone (ROCEPHIN) 1 g in sodium chloride 0.9 % 100 mL IVPB (0 g Intravenous Stopped 04/08/21 1148)     ED Discharge Orders    None       Note:  This document was prepared using Dragon voice recognition software and may include unintentional dictation errors.   Concha Se, MD 04/08/21 [phone]

## 2021-04-08 NOTE — ED Notes (Signed)
Pt changed of urine incontinence. In and out performed with Caitlyn NT

## 2021-04-08 NOTE — ED Notes (Signed)
Attempted to call [facility] x2. Left message with [employer] operational officer for RN to return call

## 2021-04-09 ENCOUNTER — Encounter: Payer: Self-pay | Admitting: Internal Medicine

## 2021-04-09 DIAGNOSIS — R531 Weakness: Secondary | ICD-10-CM | POA: Diagnosis not present

## 2021-04-09 LAB — CBC
HCT: 40 % (ref 39.0–52.0)
Hemoglobin: 13.9 g/dL (ref 13.0–17.0)
MCH: 32.5 pg (ref 26.0–34.0)
MCHC: 34.8 g/dL (ref 30.0–36.0)
MCV: 93.5 fL (ref 80.0–100.0)
Platelets: 145 10*3/uL — ABNORMAL LOW (ref 150–400)
RBC: 4.28 MIL/uL (ref 4.22–5.81)
RDW: 12.5 % (ref 11.5–15.5)
WBC: 5.6 10*3/uL (ref 4.0–10.5)
nRBC: 0 % (ref 0.0–0.2)

## 2021-04-09 LAB — BASIC METABOLIC PANEL
Anion gap: 9 (ref 5–15)
BUN: 15 mg/dL (ref 8–23)
CO2: 24 mmol/L (ref 22–32)
Calcium: 8.7 mg/dL — ABNORMAL LOW (ref 8.9–10.3)
Chloride: 107 mmol/L (ref 98–111)
Creatinine, Ser: 0.92 mg/dL (ref 0.61–1.24)
GFR, Estimated: >60 mL/min (ref >60)
Glucose, Bld: 95 mg/dL (ref 70–99)
Potassium: 4 mmol/L (ref 3.5–5.1)
Sodium: 140 mmol/L (ref 135–145)

## 2021-04-09 MED ORDER — ADULT MULTIVITAMIN W/MINERALS CH
1.0000 | ORAL_TABLET | Freq: Every day | ORAL | Status: DC
  Administered 2021-04-09 – 2021-04-20 (×12): 1 via ORAL
  Filled 2021-04-09 (×12): qty 1

## 2021-04-09 MED ORDER — SODIUM CHLORIDE 0.9 % IV SOLN: INTRAVENOUS | Status: DC | PRN

## 2021-04-09 MED ORDER — PROSOURCE PLUS PO LIQD
30.0000 mL | Freq: Two times a day (BID) | ORAL | Status: DC
  Administered 2021-04-09 – 2021-04-19 (×21): 30 mL via ORAL
  Filled 2021-04-09 (×24): qty 30

## 2021-04-09 NOTE — Evaluation (Signed)
Occupational Therapy Evaluation Patient Details Name: Justin Kim MRN: [id number] DOB: 04/27/1946 Today's Date: 04/09/2021    History of Present Illness Pt admitted for generalized weakness with AMS x 2 days. History includes Parkinson's dx, HTN, and lewy body dementia.   Clinical Impression   Mr Justin Kim was seen for OT evaluation this date. Prior to hospital admission, pt was MOD I for mobility and ADLs. Pt lives at [facility]. Pt presents to acute OT demonstrating impaired ADL performance and functional mobility 2/2 decreased safety awareness, poor activity tolerance, and functional strength/ROM/balance deficits. Pt currently requires MOD A exit bed, MAX A return to bed. CGA static sitting balnace with BUE support decreasing to MIN A 2/2 L lateral lean as pt fatigue. MAX A for lateral scoot t/f, unable to clear rear for STS. Pt would benefit from skilled OT to address noted impairments and functional limitations (see below for any additional details) in order to maximize safety and independence while minimizing falls risk and caregiver burden. Upon hospital discharge, recommend STR to maximize pt safety and return to PLOF.     Follow Up Recommendations  SNF    Equipment Recommendations  None recommended by OT    Recommendations for Other Services       Precautions / Restrictions Precautions Precautions: Fall Restrictions Weight Bearing Restrictions: No      Mobility Bed Mobility Overal bed mobility: Needs Assistance Bed Mobility: Supine to Sit;Sit to Supine     Supine to sit: Mod assist Sit to supine: Max assist        Transfers Overall transfer level: Needs assistance   Transfers: Lateral/Scoot Transfers          Lateral/Scoot Transfers: Max assist General transfer comment: unable to clear rear for STS. MAX A for lateral scoot at EOB    Balance Overall balance assessment: Needs assistance Sitting-balance support: Feet supported;Bilateral upper extremity  supported Sitting balance-Leahy Scale: Fair Sitting balance - Comments: has difficulty maintaining upright posture. L lateral leaning noted                                   ADL either performed or assessed with clinical judgement   ADL Overall ADL's : Needs assistance/impaired                                       General ADL Comments: CGA static sitting balnace with BUE support decreasing to MIN A 2/2 L lateral lean as pt fatigue.                  Pertinent Vitals/Pain Pain Assessment: No/denies pain     Hand Dominance     Extremity/Trunk Assessment Upper Extremity Assessment Upper Extremity Assessment: Generalized weakness   Lower Extremity Assessment Lower Extremity Assessment: Generalized weakness       Communication Communication Communication: No difficulties   Cognition Arousal/Alertness: Awake/alert Behavior During Therapy: Flat affect Overall Cognitive Status: History of cognitive impairments - at baseline                                 General Comments: pt alert and oriented to self. Pleasant. Takes increased time for task initiation   General Comments       Exercises Exercises: Other exercises Other Exercises Other  Exercises: Pt educated re; OT role, DME recs, d/c recs, falls prevention, HEP Other Exercises: LBD, Sup<>sit, sitting balance/tolerance, lateral scoot   Shoulder Instructions      Home Living Family/patient expects to be discharged to:: Assisted living                             Home Equipment: Walker - 2 wheels;Wheelchair - manual   Additional Comments: lives at [facility]      Prior Functioning/Environment Level of Independence: Needs assistance        Comments: pt is poor historian. Reports he transfers with RW to [facility] with staff assistance and then self propels. Unsure of accuracy        OT Problem List: Decreased strength;Decreased range of  motion;Decreased activity tolerance;Impaired balance (sitting and/or standing);Decreased safety awareness;Decreased knowledge of use of DME or AE      OT Treatment/Interventions: Self-care/ADL training;Therapeutic exercise;Energy conservation;DME and/or AE instruction;Therapeutic activities;Patient/family education;Balance training    OT Goals(Current goals can be found in the care plan section) Acute Rehab OT Goals Patient Stated Goal: does not state OT Goal Formulation: With patient Time For Goal Achievement: 04/23/21 Potential to Achieve Goals: Good ADL Goals Pt Will Perform Grooming: with modified independence;sitting Pt Will Perform Lower Body Dressing: with modified independence;sitting/lateral leans Pt Will Transfer to Toilet: stand pivot transfer;bedside commode;with min assist (c LRAD PRN)  OT Frequency: Min 1X/week   Barriers to D/C: Decreased caregiver support             AM-PAC OT "6 Clicks" Daily Activity     Outcome Measure Help from another person eating meals?: A Little Help from another person taking care of personal grooming?: A Lot Help from another person toileting, which includes using toliet, bedpan, or urinal?: A Lot Help from another person bathing (including washing, rinsing, drying)?: A Lot Help from another person to put on and taking off regular upper body clothing?: A Lot Help from another person to put on and taking off regular lower body clothing?: A Lot 6 Click Score: 13   End of Session    Activity Tolerance: Patient tolerated treatment well Patient left: in bed;with call bell/phone within reach;with bed alarm set  OT Visit Diagnosis: Unsteadiness on feet (R26.81)                Time: 5284-1324 OT Time Calculation (min): 15 min Charges:  OT General Charges $OT Visit: 1 Visit OT Evaluation $OT Eval Low Complexity: 1 Low OT Treatments $Self Care/Home Management : 8-22 mins  Kathie Dike, M.S. OTR/L  04/09/21, 4:34 PM  ascom  [phone]

## 2021-04-09 NOTE — Progress Notes (Signed)
PROGRESS NOTE    Justin Kim  YIF:[id number] DOB: 01-05-1946 DOA: 04/08/2021 PCP: Pcp, No  128A/128A-AA   Assessment & Plan:   Principal Problem:   Generalized weakness Active Problems:   Acute lower UTI   Dementia due to Parkinson's disease without behavioral disturbance (HCC)   Parkinson disease (HCC)   Unspecified atrial fibrillation (HCC)   COVID-19 virus detected   Justin Kim is a 75 y.o. male with medical history significant for Parkinson's disease with Lewy body dementia and hypertension who was brought to the ER by EMS for evaluation of weakness and disorientation for about 2 days.  Patient usually ambulates with a rolling walker has been too weak to do that.  No history of recent falls.  His brother-in-law was at the bedside states that he has difficulty with ambulation due to his Parkinson's disease.   Generalized weakness Appears to be multifactorial and related to Parkinson's disease as well as recent UTI. Plan: --PT/OT --pt prefers to return to his previous ALF  UTI 2/2 proteus mirabilis Patient has significant pyuria, and has dysuria. --started on ceftriaxone Plan: --cont ceftriaxone pending urine cx sensitivities  Parkinson's disease with dementia without behavioral disturbance Continue Sinemet Continue Exelon and Remeron --PT/OT  History of A. Fib --occasional RVR not sustained Patient is not on anticoagulation due to increased risk of falls --cont home diltiazem  COVID-19 virus detected Patient had a screening test on admission and tested positive for COVID-19 virus He is vaccinated He is currently asymptomatic, not hypoxic --monitor for now and no need for COVID tx at this time   DVT prophylaxis: Lovenox SQ Code Status: Full code  Family Communication: sister updated on the phone today Level of care: Med-Surg Dispo:   The patient is from: ALF Anticipated d/c is to: ALF if facility will accept Anticipated d/c date is: 1-2  days Patient currently is not medically ready to d/c due to: UTI on IV abx pending urine cx   Subjective and Interval History:  Pt admitted to dysuria that's been going on for a while.  No abdominal pain.  Had progressive weakness.   Objective: Vitals:   04/09/21 1143 04/09/21 1545 04/09/21 2125 04/10/21 0112  BP: 122/80 132/84 139/85 (!) 127/92  Pulse: 79 80 78 93  Resp: 18 16 16    Temp: 98.9 F (37.2 C) 98 F (36.7 C) 99.2 F (37.3 C) 98.8 F (37.1 C)  TempSrc:   Oral Oral  SpO2: 97% 98% 97% 96%  Weight:      Height:        Intake/Output Summary (Last 24 hours) at 04/10/2021 0153 Last data filed at 04/09/2021 2132 Gross per 24 hour  Intake 98.52 ml  Output 850 ml  Net -751.48 ml   Filed Weights   04/08/21 0816  Weight: 81.6 kg    Examination:   Constitutional: NAD, AAOx3 HEENT: conjunctivae and lids normal, EOMI CV: No cyanosis.   RESP: normal respiratory effort, on RA Extremities: No effusions, edema in BLE SKIN: warm, dry Neuro: II - XII grossly intact.   Psych: depressed mood and affect.     Data Reviewed: I have personally reviewed following labs and imaging studies  CBC: Recent Labs  Lab 04/08/21 0817 04/09/21 0322  WBC 8.0 5.6  NEUTROABS 6.6  --   HGB 15.4 13.9  HCT 44.2 40.0  MCV 93.1 93.5  PLT 160 145*   Basic Metabolic Panel: Recent Labs  Lab 04/08/21 0817 04/09/21 0322  NA 141 140  K 3.9  4.0  CL 108 107  CO2 24 24  GLUCOSE 99 95  BUN 17 15  CREATININE 0.98 0.92  CALCIUM 9.1 8.7*   GFR: Estimated Creatinine Clearance: 73.9 mL/min (by C-G formula based on SCr of 0.92 mg/dL). Liver Function Tests: Recent Labs  Lab 04/08/21 0817  AST 18  ALT 13  ALKPHOS 93  BILITOT 1.5*  PROT 7.1  ALBUMIN 4.2   No results for input(s): LIPASE, AMYLASE in the last 168 hours. No results for input(s): AMMONIA in the last 168 hours. Coagulation Profile: No results for input(s): INR, PROTIME in the last 168 hours. Cardiac Enzymes: No  results for input(s): CKTOTAL, CKMB, CKMBINDEX, TROPONINI in the last 168 hours. BNP (last 3 results) No results for input(s): PROBNP in the last 8760 hours. HbA1C: No results for input(s): HGBA1C in the last 72 hours. CBG: No results for input(s): GLUCAP in the last 168 hours. Lipid Profile: No results for input(s): CHOL, HDL, LDLCALC, TRIG, CHOLHDL, LDLDIRECT in the last 72 hours. Thyroid Function Tests: No results for input(s): TSH, T4TOTAL, FREET4, T3FREE, THYROIDAB in the last 72 hours. Anemia Panel: No results for input(s): VITAMINB12, FOLATE, FERRITIN, TIBC, IRON, RETICCTPCT in the last 72 hours. Sepsis Labs: No results for input(s): PROCALCITON, LATICACIDVEN in the last 168 hours.  Recent Results (from the past 240 hour(s))  Resp Panel by RT-PCR (Flu A&B, Covid) Nasopharyngeal Swab     Status: Abnormal   Collection Time: 04/08/21  8:18 AM   Specimen: Nasopharyngeal Swab; Nasopharyngeal(NP) swabs in vial transport medium  Result Value Ref Range Status   SARS Coronavirus 2 by RT PCR POSITIVE (A) NEGATIVE Final    Comment: RESULT CALLED TO, READ BACK BY AND VERIFIED WITH:  BRIANNA CHAPMON AT 1012 04/08/21 SDR (NOTE) SARS-CoV-2 target nucleic acids are DETECTED.  The SARS-CoV-2 RNA is generally detectable in upper respiratory specimens during the acute phase of infection. Positive results are indicative of the presence of the identified virus, but do not rule out bacterial infection or co-infection with other pathogens not detected by the test. Clinical correlation with patient history and other diagnostic information is necessary to determine patient infection status. The expected result is Negative.  Fact Sheet for Patients: BloggerCourse.com  Fact Sheet for Healthcare Providers: SeriousBroker.it  This test is not yet approved or cleared by the [place] FDA and  has been authorized for detection and/or diagnosis of  SARS-CoV-2 by FDA under an Emergency Use Authorization (EUA).  This EUA will remain in effect (meaning this test can  be used) for the duration of  the COVID-19 declaration under Section 564(b)(1) of the Act, 21 U.S.C. section 360bbb-3(b)(1), unless the authorization is terminated or revoked sooner.     Influenza A by PCR NEGATIVE NEGATIVE Final   Influenza B by PCR NEGATIVE NEGATIVE Final    Comment: (NOTE) The Xpert Xpress SARS-CoV-2/FLU/RSV plus assay is intended as an aid in the diagnosis of influenza from Nasopharyngeal swab specimens and should not be used as a sole basis for treatment. Nasal washings and aspirates are unacceptable for Xpert Xpress SARS-CoV-2/FLU/RSV testing.  Fact Sheet for Patients: BloggerCourse.com  Fact Sheet for Healthcare Providers: SeriousBroker.it  This test is not yet approved or cleared by the [place] FDA and has been authorized for detection and/or diagnosis of SARS-CoV-2 by FDA under an Emergency Use Authorization (EUA). This EUA will remain in effect (meaning this test can be used) for the duration of the COVID-19 declaration under Section 564(b)(1) of the Act, 21  U.S.C. section 360bbb-3(b)(1), unless the authorization is terminated or revoked.  Performed at [facility], [address], [place], [place] [address]   Urine culture     Status: Abnormal (Preliminary result)   Collection Time: 04/08/21  8:18 AM   Specimen: Urine, Random  Result Value Ref Range Status   Specimen Description   Final    URINE, RANDOM Performed at [facility], [address], [place], [place] [address]    Special Requests   Final    NONE Performed at [facility], [address], [place], [place] [address]    Culture (A)  Final    >=100,000 COLONIES/mL PROTEUS MIRABILIS SUSCEPTIBILITIES TO FOLLOW Performed at [facility], [address] [place], [place]  [address]    Report Status PENDING  Incomplete  MRSA PCR Screening     Status: None   Collection Time: 04/08/21 12:43 PM   Specimen: Nasopharyngeal  Result Value Ref Range Status   MRSA by PCR NEGATIVE NEGATIVE Final    Comment:        The GeneXpert MRSA Assay (FDA approved for NASAL specimens only), is one component of a comprehensive MRSA colonization surveillance program. It is not intended to diagnose MRSA infection nor to guide or monitor treatment for MRSA infections. Performed at [facility], [address], [place], [place] [address]       Radiology Studies: DG Chest 2 View  Result Date: 04/08/2021 CLINICAL DATA:  Pt to ED [facility] from [facility] for AMS x2 days. Hx parkinsons, HTNPt reports having fall, unsure of details, EMS did not receive info of fall with report. Pt alert and oriented on arrival. Hx of UTIWheelchair use at home EXAM: CHEST - 2 VIEW COMPARISON:  None. FINDINGS: Cardiac silhouette top-normal in size. No mediastinal or hilar masses. No evidence of adenopathy. Clear lungs.  No pleural effusion or pneumothorax. Skeletal structures are demineralized but intact. IMPRESSION: No active cardiopulmonary disease. Electronically Signed   By: Amie Portland M.D.   On: 04/08/2021 08:39   CT Head Wo Contrast  Result Date: 04/08/2021 CLINICAL DATA:  Altered mental status.  Fall. EXAM: CT HEAD WITHOUT CONTRAST TECHNIQUE: Contiguous axial images were obtained from the base of the skull through the vertex without intravenous contrast. COMPARISON:  None. FINDINGS: Brain: There is atrophy and chronic small vessel disease changes. No acute intracranial abnormality. Specifically, no hemorrhage, hydrocephalus, mass lesion, acute infarction, or significant intracranial injury. Vascular: No hyperdense vessel or unexpected calcification. Skull: No acute calvarial abnormality. Sinuses/Orbits: Mucosal thickening in scattered ethmoid air cells and the left maxillary sinus. No  air-fluid levels. Other: None IMPRESSION: Atrophy, chronic microvascular disease. No acute intracranial abnormality. Chronic sinusitis. Electronically Signed   By: Charlett Nose M.D.   On: 04/08/2021 08:56   CT Cervical Spine Wo Contrast  Result Date: 04/08/2021 CLINICAL DATA:  Altered mental status.  Fall. EXAM: CT CERVICAL SPINE WITHOUT CONTRAST TECHNIQUE: Multidetector CT imaging of the cervical spine was performed without intravenous contrast. Multiplanar CT image reconstructions were also generated. COMPARISON:  None. FINDINGS: Alignment: No subluxation. Skull base and vertebrae: No acute fracture.  Mild osteopenia. Soft tissues and spinal canal: No prevertebral fluid or swelling. No visible canal hematoma. Disc levels: Early degenerative disc disease in the mid to lower cervical spine with early disc space narrowing and anterior spurring. Mild bilateral degenerative facet disease. Upper chest: No acute findings. Other: None IMPRESSION: Early degenerative changes in the cervical spine. Mild osteopenia. No acute  bony abnormality. Electronically Signed   By: Charlett NoseKevin  Dover M.D.   On: 04/08/2021 08:58     Scheduled Meds: . (feeding supplement) PROSource Plus  30 mL Oral BID BM  . carbidopa-levodopa  1 tablet Oral TID  . diltiazem  120 mg Oral Daily  . enoxaparin (LOVENOX) injection  40 mg Subcutaneous Q24H  . escitalopram  20 mg Oral Daily  . mirtazapine  30 mg Oral QHS  . multivitamin with minerals  1 tablet Oral Daily  . polyvinyl alcohol  1 drop Both Eyes BID  . rivastigmine  9.5 mg Transdermal Daily  . sodium chloride flush  3 mL Intravenous Q12H  . Vitamin D (Ergocalciferol)  50,000 Units Oral Weekly   Continuous Infusions: . sodium chloride    . sodium chloride    . cefTRIAXone (ROCEPHIN)  IV 1 g (04/09/21 1243)     LOS: 2 days     Darlin Priestlyina Erlinda Solinger, MD [facility] If 7PM-7AM, please contact night-coverage 04/10/2021, 1:53 AM

## 2021-04-09 NOTE — Progress Notes (Signed)
Initial Nutrition Assessment  DOCUMENTATION CODES:   Not applicable  INTERVENTION:   -Feeding assistance with meals -MVI with minerals daily -30 ml Prosource Plus BID, each supplement provides 100 kcals and 15 grams protein -Magic cup TID with meals, each supplement provides 290 kcal and 9 grams of protein -Liberalize diet to regular  NUTRITION DIAGNOSIS:   Increased nutrient needs related to acute illness (COVID-19) as evidenced by estimated needs.  GOAL:   Patient will meet greater than or equal to 90% of their needs  MONITOR:   PO intake,Supplement acceptance,Labs,Weight trends,I & O's,Skin  REASON FOR ASSESSMENT:   Malnutrition Screening Tool    ASSESSMENT:   Justin Kim is a 75 y.o. male with medical history significant for Parkinson's disease with Lewy body dementia and hypertension who was brought to the ER by EMS for evaluation of weakness and disorientation for about 2 days.  Patient usually ambulates with a rolling walker has been too weak to do that.  No history of recent falls.  His brother-in-law was at the bedside states that he has difficulty with ambulation due to his Parkinson's disease.  Pt admitted with generalized weakness and UTI.   Reviewed I/O's: +525 ml x 24 hours  UOP: 75 ml x 24 hours  Pt unavailable at time of visit. Attempted to speak with pt via call to hospital room phone, however, unable to reach.   Per H&P, pt ambulated with a walker PTA, but has been too weak to do this for the past 2 days PTA. He resides in an ALF and has history of Parkinson's and dementia (pt with difficulty with ambulation at baseline secondary to Parkinson's disease).   Pt is currently on a 2 gram sodium diet. No meal completion records available at this time. Pt would benefit from feeding assistance secondary to Parkinson's disease.   No wt hx available to assess changes at this time.   Pt with increased nutritional needs secondary to acute illness and would  benefit from addition of oral nutrition supplements.   Medications reviewed and include cardizem and vitamin D.   Per therapy notes, recommending SNF at discharge.   Labs reviewed.   Diet Order:   Diet Order            Diet 2 gram sodium Room service appropriate? Yes; Fluid consistency: Thin  Diet effective now                 EDUCATION NEEDS:   No education needs have been identified at this time  Skin:  Skin Assessment: Reviewed RN Assessment  Last BM:  Unknown  Height:   Ht Readings from Last 1 Encounters:  04/08/21 5\' 11"  (1.803 m)    Weight:   Wt Readings from Last 1 Encounters:  04/08/21 81.6 kg    Ideal Body Weight:  78.2 kg  BMI:  Body mass index is 25.1 kg/m.  Estimated Nutritional Needs:   Kcal:  2050-2250  Protein:  105-120 grams  Fluid:  > 2 L    07-29-1979, RD, LDN, CDCES Registered Dietitian II Certified Diabetes Care and Education Specialist Please refer to [facility] for RD and/or RD on-call/weekend/after hours pager

## 2021-04-10 DIAGNOSIS — R531 Weakness: Secondary | ICD-10-CM | POA: Diagnosis not present

## 2021-04-10 LAB — TSH: TSH: 0.921 u[IU]/mL (ref 0.350–4.500)

## 2021-04-10 LAB — BASIC METABOLIC PANEL
Anion gap: 11 (ref 5–15)
BUN: 17 mg/dL (ref 8–23)
CO2: 23 mmol/L (ref 22–32)
Calcium: 8.6 mg/dL — ABNORMAL LOW (ref 8.9–10.3)
Chloride: 106 mmol/L (ref 98–111)
Creatinine, Ser: 0.93 mg/dL (ref 0.61–1.24)
GFR, Estimated: >60 mL/min (ref >60)
Glucose, Bld: 105 mg/dL — ABNORMAL HIGH (ref 70–99)
Potassium: 3.8 mmol/L (ref 3.5–5.1)
Sodium: 140 mmol/L (ref 135–145)

## 2021-04-10 LAB — CBC
HCT: 43.1 % (ref 39.0–52.0)
Hemoglobin: 15 g/dL (ref 13.0–17.0)
MCH: 32.9 pg (ref 26.0–34.0)
MCHC: 34.8 g/dL (ref 30.0–36.0)
MCV: 94.5 fL (ref 80.0–100.0)
Platelets: 148 10*3/uL — ABNORMAL LOW (ref 150–400)
RBC: 4.56 MIL/uL (ref 4.22–5.81)
RDW: 12.6 % (ref 11.5–15.5)
WBC: 7.4 10*3/uL (ref 4.0–10.5)
nRBC: 0 % (ref 0.0–0.2)

## 2021-04-10 LAB — MAGNESIUM: Magnesium: 2.1 mg/dL (ref 1.7–2.4)

## 2021-04-10 LAB — URINE CULTURE: Culture: >=100000 — AB

## 2021-04-10 MED ORDER — GUAIFENESIN ER 600 MG PO TB12
600.0000 mg | ORAL_TABLET | Freq: Two times a day (BID) | ORAL | Status: DC
  Administered 2021-04-10 – 2021-04-14 (×8): 600 mg via ORAL
  Filled 2021-04-10 (×8): qty 1

## 2021-04-10 MED ORDER — DILTIAZEM HCL ER COATED BEADS 240 MG PO CP24
240.0000 mg | ORAL_CAPSULE | Freq: Every day | ORAL | Status: DC
  Administered 2021-04-11: 08:00:00 240 mg via ORAL
  Filled 2021-04-10: qty 1

## 2021-04-10 MED ORDER — DILTIAZEM HCL ER COATED BEADS 120 MG PO CP24
120.0000 mg | ORAL_CAPSULE | Freq: Once | ORAL | Status: AC
  Administered 2021-04-10: 12:00:00 120 mg via ORAL
  Filled 2021-04-10: qty 1

## 2021-04-10 NOTE — Care Management Important Message (Signed)
Important Message  Patient Details  Name: Justin Kim MRN: [id number] Date of Birth: [birth year]   Medicare Important Message Given:  Other (see comment)  Patient is in an isolation room so I tried calling the room 2 times but line rang busy.  Tried calling HCPOA, [name], B-I-L at [phone] and left a message asking he return my call at his convenience so I could review the Important Message from Medicare with him.  Will await a return call.   Olegario Messier A Hendryx Ricke 04/10/2021, 11:18 AM

## 2021-04-10 NOTE — Progress Notes (Signed)
PROGRESS NOTE    Justin Kim  ESP:[id number] DOB: 03/21/1946 DOA: 04/08/2021 PCP: Pcp, No  128A/128A-AA   Assessment & Plan:   Principal Problem:   Generalized weakness Active Problems:   Acute lower UTI   Dementia due to Parkinson's disease without behavioral disturbance (HCC)   Parkinson disease (HCC)   Unspecified atrial fibrillation (HCC)   COVID-19 virus detected   Justin Kim is a 75 y.o. male with medical history significant for Parkinson's disease with Lewy body dementia and hypertension who was brought to the ER by EMS for evaluation of weakness and disorientation for about 2 days.  Patient usually ambulates with a rolling walker has been too weak to do that.  No history of recent falls.  His brother-in-law was at the bedside states that he has difficulty with ambulation due to his Parkinson's disease.   Generalized weakness Appears to be multifactorial and related to Parkinson's disease as well as recent UTI. Plan: --PT/OT --pt prefers to return to his previous ALF --ALF to evaluate pt on Monday for suitability of pt returning to ALF  UTI 2/2 proteus mirabilis Patient has significant pyuria, and has dysuria. --started on ceftriaxone Plan: --cont ceftriaxone pending urine cx sensitivities  Parkinson's disease with dementia without behavioral disturbance Continue Sinemet Continue Exelon and Remeron --PT/OT rec SNF  History of A. Fib --occasional RVR not sustained Patient is not on anticoagulation due to increased risk of falls --TSH wnl --increase home Cardizem CD to 240 mg daily (up from 120)  COVID-19 virus infection Patient had a screening test on admission and tested positive for COVID-19 virus He is vaccinated. Has a cough productive of sputum, but not hypoxic --monitor for now and no need for COVID tx at this time --Mucinex BID   DVT prophylaxis: Lovenox SQ Code Status: Full code  Family Communication:  Level of care: Med-Surg Dispo:    The patient is from: ALF Anticipated d/c is to: SNF Anticipated d/c date is: Monday if ALF will accept pt back Patient currently is medically ready to d/c.   Subjective and Interval History:  Pt started having productive cough, though cough is weak.     Objective: Vitals:   04/09/21 2125 04/10/21 0112 04/10/21 0517 04/10/21 0834  BP: 139/85 (!) 127/92 121/88 (!) 147/81  Pulse: 78 93 (!) 101 (!) 102  Resp: 16  16 17   Temp: 99.2 F (37.3 C) 98.8 F (37.1 C) 99.4 F (37.4 C) 99.1 F (37.3 C)  TempSrc: Oral Oral    SpO2: 97% 96% 99% 98%  Weight:      Height:        Intake/Output Summary (Last 24 hours) at 04/10/2021 1527 Last data filed at 04/09/2021 2132 Gross per 24 hour  Intake --  Output 400 ml  Net -400 ml   Filed Weights   04/08/21 0816  Weight: 81.6 kg    Examination:   Constitutional: NAD, alert, oriented to person and place HEENT: conjunctivae and lids normal, EOMI CV: No cyanosis.   RESP: normal respiratory effort, gurgling, weak cough with sputum production, on RA Extremities: No effusions, edema in BLE SKIN: warm, dry Neuro: II - XII grossly intact.   Psych: depressed mood and affect.     Data Reviewed: I have personally reviewed following labs and imaging studies  CBC: Recent Labs  Lab 04/08/21 0817 04/09/21 0322 04/10/21 0552  WBC 8.0 5.6 7.4  NEUTROABS 6.6  --   --   HGB 15.4 13.9 15.0  HCT 44.2 40.0  43.1  MCV 93.1 93.5 94.5  PLT 160 145* 148*   Basic Metabolic Panel: Recent Labs  Lab 04/08/21 0817 04/09/21 0322 04/10/21 0552  NA 141 140 140  K 3.9 4.0 3.8  CL 108 107 106  CO2 24 24 23   GLUCOSE 99 95 105*  BUN 17 15 17   CREATININE 0.98 0.92 0.93  CALCIUM 9.1 8.7* 8.6*  MG  --   --  2.1   GFR: Estimated Creatinine Clearance: 73.1 mL/min (by C-G formula based on SCr of 0.93 mg/dL). Liver Function Tests: Recent Labs  Lab 04/08/21 0817  AST 18  ALT 13  ALKPHOS 93  BILITOT 1.5*  PROT 7.1  ALBUMIN 4.2   No results for  input(s): LIPASE, AMYLASE in the last 168 hours. No results for input(s): AMMONIA in the last 168 hours. Coagulation Profile: No results for input(s): INR, PROTIME in the last 168 hours. Cardiac Enzymes: No results for input(s): CKTOTAL, CKMB, CKMBINDEX, TROPONINI in the last 168 hours. BNP (last 3 results) No results for input(s): PROBNP in the last 8760 hours. HbA1C: No results for input(s): HGBA1C in the last 72 hours. CBG: No results for input(s): GLUCAP in the last 168 hours. Lipid Profile: No results for input(s): CHOL, HDL, LDLCALC, TRIG, CHOLHDL, LDLDIRECT in the last 72 hours. Thyroid Function Tests: Recent Labs    04/09/21 0322  TSH 0.921   Anemia Panel: No results for input(s): VITAMINB12, FOLATE, FERRITIN, TIBC, IRON, RETICCTPCT in the last 72 hours. Sepsis Labs: No results for input(s): PROCALCITON, LATICACIDVEN in the last 168 hours.  Recent Results (from the past 240 hour(s))  Resp Panel by RT-PCR (Flu A&B, Covid) Nasopharyngeal Swab     Status: Abnormal   Collection Time: 04/08/21  8:18 AM   Specimen: Nasopharyngeal Swab; Nasopharyngeal(NP) swabs in vial transport medium  Result Value Ref Range Status   SARS Coronavirus 2 by RT PCR POSITIVE (A) NEGATIVE Final    Comment: RESULT CALLED TO, READ BACK BY AND VERIFIED WITH:  BRIANNA CHAPMON AT 1012 04/08/21 SDR (NOTE) SARS-CoV-2 target nucleic acids are DETECTED.  The SARS-CoV-2 RNA is generally detectable in upper respiratory specimens during the acute phase of infection. Positive results are indicative of the presence of the identified virus, but do not rule out bacterial infection or co-infection with other pathogens not detected by the test. Clinical correlation with patient history and other diagnostic information is necessary to determine patient infection status. The expected result is Negative.  Fact Sheet for Patients: 04/10/21  Fact Sheet for Healthcare  Providers: 04/10/21  This test is not yet approved or cleared by the [employer] FDA and  has been authorized for detection and/or diagnosis of SARS-CoV-2 by FDA under an Emergency Use Authorization (EUA).  This EUA will remain in effect (meaning this test can  be used) for the duration of  the COVID-19 declaration under Section 564(b)(1) of the Act, 21 U.S.C. section 360bbb-3(b)(1), unless the authorization is terminated or revoked sooner.     Influenza A by PCR NEGATIVE NEGATIVE Final   Influenza B by PCR NEGATIVE NEGATIVE Final    Comment: (NOTE) The Xpert Xpress SARS-CoV-2/FLU/RSV plus assay is intended as an aid in the diagnosis of influenza from Nasopharyngeal swab specimens and should not be used as a sole basis for treatment. Nasal washings and aspirates are unacceptable for Xpert Xpress SARS-CoV-2/FLU/RSV testing.  Fact Sheet for Patients: [employer]  Fact Sheet for Healthcare Providers: [place]  This test is not yet approved or cleared by  the [employer] and has been authorized for detection and/or diagnosis of SARS-CoV-2 by FDA under an Emergency Use Authorization (EUA). This EUA will remain in effect (meaning this test can be used) for the duration of the COVID-19 declaration under Section 564(b)(1) of the Act, 21 U.S.C. section 360bbb-3(b)(1), unless the authorization is terminated or revoked.  Performed at [facility], [address] [place], [place] [address]   Urine culture     Status: Abnormal   Collection Time: 04/08/21  8:18 AM   Specimen: Urine, Random  Result Value Ref Range Status   Specimen Description   Final    URINE, RANDOM Performed at [facility], [address] [place], [place] [address]    Special Requests   Final    NONE Performed at [facility], [address] [place], [place] [address]     Culture >=100,000 COLONIES/mL PROTEUS MIRABILIS (A)  Final   Report Status 04/10/2021 FINAL  Final   Organism ID, Bacteria PROTEUS MIRABILIS (A)  Final      Susceptibility   Proteus mirabilis - MIC*    AMPICILLIN <=2 SENSITIVE Sensitive     CEFAZOLIN <=4 SENSITIVE Sensitive     CEFEPIME <=0.12 SENSITIVE Sensitive     CEFTRIAXONE <=0.25 SENSITIVE Sensitive     CIPROFLOXACIN <=0.25 SENSITIVE Sensitive     GENTAMICIN <=1 SENSITIVE Sensitive     IMIPENEM 8 INTERMEDIATE Intermediate     NITROFURANTOIN 128 RESISTANT Resistant     TRIMETH/SULFA <=20 SENSITIVE Sensitive     AMPICILLIN/SULBACTAM <=2 SENSITIVE Sensitive     PIP/TAZO <=4 SENSITIVE Sensitive     * >=100,000 COLONIES/mL PROTEUS MIRABILIS  MRSA PCR Screening     Status: None   Collection Time: 04/08/21 12:43 PM   Specimen: Nasopharyngeal  Result Value Ref Range Status   MRSA by PCR NEGATIVE NEGATIVE Final    Comment:        The GeneXpert MRSA Assay (FDA approved for NASAL specimens only), is one component of a comprehensive MRSA colonization surveillance program. It is not intended to diagnose MRSA infection nor to guide or monitor treatment for MRSA infections. Performed at [facility], [address] [place], [place] [address]       Radiology Studies: No results found.   Scheduled Meds: . (feeding supplement) PROSource Plus  30 mL Oral BID BM  . carbidopa-levodopa  1 tablet Oral TID  . [START ON 04/11/2021] diltiazem  240 mg Oral Daily  . enoxaparin (LOVENOX) injection  40 mg Subcutaneous Q24H  . escitalopram  20 mg Oral Daily  . guaiFENesin  600 mg Oral BID  . mirtazapine  30 mg Oral QHS  . multivitamin with minerals  1 tablet Oral Daily  . polyvinyl alcohol  1 drop Both Eyes BID  . rivastigmine  9.5 mg Transdermal Daily  . sodium chloride flush  3 mL Intravenous Q12H  . Vitamin D (Ergocalciferol)  50,000 Units Oral Weekly   Continuous Infusions: . sodium chloride    . sodium chloride    .  cefTRIAXone (ROCEPHIN)  IV 1 g (04/10/21 1130)     LOS: 2 days     Darlin Priestly, MD [facility] If 7PM-7AM, please contact night-coverage 04/10/2021, 3:27 PM

## 2021-04-10 NOTE — Care Management Important Message (Signed)
Important Message  Patient Details  Name: Justin Kim MRN: [id number] Date of Birth: [birth year]   Medicare Important Message Given:  Yes  [name], HCPOA returned my call and I reviewed the Important Message from Medicare with him. He said his wife was at the hospital now working with the nursing staff on the discharge plan. I asked if he would like a copy and he replied yes.  I sent securely to [email or web address].  I thanked him for his time.  Olegario Messier A Abbegail Matuska 04/10/2021, 11:41 AM

## 2021-04-10 NOTE — Progress Notes (Signed)
Occupational Therapy Treatment Patient Details Name: Justin Kim MRN: [id number] DOB: 1946-12-17 Today's Date: 04/10/2021    History of present illness Pt admitted for generalized weakness with AMS x 2 days. History includes Parkinson's dx, HTN, and lewy body dementia.   OT comments  Justin Kim presented today with generalized weakness and limited endurance. He reported that he did not have any pain but said that he was "very tired." He required Mod/Max A for bed mobility, was unable to come into standing with Max A and RW. Justin Kim did participate in UB and LB therex seated EOB, with multimodal cues required to sustain activity. Pt requires Mod/Max A for feeding and drinking, as he consistently brings utensil or straw several inches to the right of his R cheek, missing his face entirely. Justin Kim is not oriented to self, location, or time. Requires Total A for scooting towards HOB in supine. Pt request cough medicine, nursing notified. Pt left in bed, alarm activated, call bell within reach.    Follow Up Recommendations  SNF    Equipment Recommendations  None recommended by OT    Recommendations for Other Services      Precautions / Restrictions Precautions Precautions: Fall Precaution Comments: COVID/Airborne and contact precautions Restrictions Weight Bearing Restrictions: No       Mobility Bed Mobility Overal bed mobility: Needs Assistance Bed Mobility: Supine to Sit;Sit to Supine     Supine to sit: Mod assist Sit to supine: Max assist        Transfers Overall transfer level: Needs assistance Equipment used: Rolling walker (2 wheeled) Transfers: Sit to/from Stand           General transfer comment: 3 attempts sit<stand, with MaxA and RW, pt unable to lift rear end off bed    Balance Overall balance assessment: Needs assistance Sitting-balance support: Feet supported;Bilateral upper extremity supported Sitting balance-Leahy Scale: Fair Sitting balance -  Comments: L lateral lean, requires verbal and tactile cues to correct. Able to perform seated therex Postural control: Left lateral lean   Standing balance-Leahy Scale: Zero Standing balance comment: pt unable to come into standing w/ MaxA                           ADL either performed or assessed with clinical judgement   ADL Overall ADL's : Needs assistance/impaired Eating/Feeding: Maximal assistance Eating/Feeding Details (indicate cue type and reason): Pt unable to bring spoon or straw to mouth -- comes 2" to R of right cheek                 Lower Body Dressing: Maximal assistance Lower Body Dressing Details (indicate cue type and reason): donning/doffing socks                     Vision   Additional Comments: unable to access   Perception     Praxis      Cognition Arousal/Alertness: Lethargic Behavior During Therapy: Flat affect Overall Cognitive Status: History of cognitive impairments - at baseline                                 General Comments: Pt appears disoriented, difficult communication; unable to follow 1-step commands        Exercises Total Joint Exercises Ankle Circles/Pumps: AROM;Both;10 reps;Seated Quad Sets: AROM;10 reps;Seated;Both Straight Leg Raises: AROM;Both;10 reps;Seated Knee Flexion: AROM;Both;10 reps;Seated Marching in  Standing: AROM;Seated;10 reps;Both Other Exercises Other Exercises: Bed mobility, LBD, sitting balance tolerance, seated UB and LB therex   Shoulder Instructions       General Comments b/l UE tremors; very soft speaking voice, making it difficult to understand pt    Pertinent Vitals/ Pain       Pain Assessment: No/denies pain  Home Living                                          Prior Functioning/Environment              Frequency  Min 1X/week        Progress Toward Goals  OT Goals(current goals can now be found in the care plan section)   Progress towards OT goals: Progressing toward goals  Acute Rehab OT Goals Patient Stated Goal: to get back to ALF Time For Goal Achievement: 04/23/21 Potential to Achieve Goals: Fair  Plan Discharge plan remains appropriate;Frequency remains appropriate    Co-evaluation                 AM-PAC OT "6 Clicks" Daily Activity     Outcome Measure   Help from another person eating meals?: A Lot Help from another person taking care of personal grooming?: A Lot Help from another person toileting, which includes using toliet, bedpan, or urinal?: A Lot Help from another person bathing (including washing, rinsing, drying)?: A Lot Help from another person to put on and taking off regular upper body clothing?: A Lot Help from another person to put on and taking off regular lower body clothing?: A Lot 6 Click Score: 12    End of Session Equipment Utilized During Treatment: Rolling walker  OT Visit Diagnosis: Unsteadiness on feet (R26.81);Muscle weakness (generalized) (M62.81);Feeding difficulties (R63.3);Other symptoms and signs involving cognitive function   Activity Tolerance Patient tolerated treatment well   Patient Left in bed;with call bell/phone within reach;with bed alarm set   Nurse Communication Other (comment) (pt requests cough syrup)        Time: 2119-4174 OT Time Calculation (min): 25 min  Charges: OT General Charges $OT Visit: 1 Visit OT Treatments $Self Care/Home Management : 23-37 mins  Latina Craver, PhD, MS, OTR/L 04/10/21, 3:44 PM

## 2021-04-10 NOTE — Progress Notes (Signed)
PT Cancellation Note  Patient Details Name: Justin Kim MRN: [id number] DOB: 1946/11/23   Cancelled Treatment:     Pt was long sitting in bed upon arriving with untouched lunch tray in front of him. Will need assistance with feeding due to cognition deficits. He was unwilling to participate in PT at this time. Max encouragement for OOB activity however pt continued to refused. Will continue efforts to participate in PT. Acute PT will continue to follow per current POC.    Rushie Chestnut 04/10/2021, 3:20 PM

## 2021-04-11 DIAGNOSIS — R531 Weakness: Secondary | ICD-10-CM | POA: Diagnosis not present

## 2021-04-11 LAB — BASIC METABOLIC PANEL
Anion gap: 9 (ref 5–15)
BUN: 20 mg/dL (ref 8–23)
CO2: 24 mmol/L (ref 22–32)
Calcium: 8.4 mg/dL — ABNORMAL LOW (ref 8.9–10.3)
Chloride: 107 mmol/L (ref 98–111)
Creatinine, Ser: 0.84 mg/dL (ref 0.61–1.24)
GFR, Estimated: >60 mL/min (ref >60)
Glucose, Bld: 129 mg/dL — ABNORMAL HIGH (ref 70–99)
Potassium: 3.5 mmol/L (ref 3.5–5.1)
Sodium: 140 mmol/L (ref 135–145)

## 2021-04-11 LAB — CBC
HCT: 41.2 % (ref 39.0–52.0)
Hemoglobin: 14.7 g/dL (ref 13.0–17.0)
MCH: 32.8 pg (ref 26.0–34.0)
MCHC: 35.7 g/dL (ref 30.0–36.0)
MCV: 92 fL (ref 80.0–100.0)
Platelets: 142 10*3/uL — ABNORMAL LOW (ref 150–400)
RBC: 4.48 MIL/uL (ref 4.22–5.81)
RDW: 12.5 % (ref 11.5–15.5)
WBC: 8 10*3/uL (ref 4.0–10.5)
nRBC: 0 % (ref 0.0–0.2)

## 2021-04-11 LAB — MAGNESIUM: Magnesium: 2.1 mg/dL (ref 1.7–2.4)

## 2021-04-11 MED ORDER — DILTIAZEM HCL ER COATED BEADS 180 MG PO CP24
180.0000 mg | ORAL_CAPSULE | Freq: Every day | ORAL | Status: DC
  Administered 2021-04-12 – 2021-04-20 (×9): 180 mg via ORAL
  Filled 2021-04-11 (×9): qty 1

## 2021-04-11 MED ORDER — CEPHALEXIN 500 MG PO CAPS
500.0000 mg | ORAL_CAPSULE | Freq: Two times a day (BID) | ORAL | Status: DC
  Administered 2021-04-12 – 2021-04-14 (×6): 500 mg via ORAL
  Filled 2021-04-11 (×6): qty 1

## 2021-04-11 NOTE — Progress Notes (Signed)
PROGRESS NOTE    Justin Kim  DXA:[id number] DOB: 01-26-1946 DOA: 04/08/2021 PCP: Pcp, No  128A/128A-AA   Assessment & Plan:   Principal Problem:   Generalized weakness Active Problems:   Acute lower UTI   Dementia due to Parkinson's disease without behavioral disturbance (HCC)   Parkinson disease (HCC)   Unspecified atrial fibrillation (HCC)   COVID-19 virus detected   Justin Kim is a 75 y.o. male with medical history significant for Parkinson's disease with Lewy body dementia and hypertension who was brought to the ER by EMS for evaluation of weakness and disorientation for about 2 days.  Patient usually ambulates with a rolling walker has been too weak to do that.  No history of recent falls.  His brother-in-law was at the bedside states that he has difficulty with ambulation due to his Parkinson's disease.   Generalized weakness Appears to be multifactorial and related to Parkinson's disease as well as recent UTI. Plan: --PT/OT --pt prefers to return to his previous ALF --ALF to evaluate pt on Monday for suitability of pt returning to ALF  UTI 2/2 proteus mirabilis Patient has significant pyuria, and has dysuria. --started on ceftriaxone Plan: --cont ceftriaxone, will transition to Keflex tomorrow  Parkinson's disease with dementia without behavioral disturbance Continue Sinemet Continue Exelon and Remeron --PT/OT rec SNF  History of A. Fib --occasional RVR not sustained Patient is not on anticoagulation due to increased risk of falls --TSH wnl --reduce Cardizem to 180 mg daily due to low BP   COVID-19 virus infection Patient had a screening test on admission and tested positive for COVID-19 virus He is vaccinated. Has a cough productive of sputum, but not hypoxic Plan: --no need for COVID tx currently --Mucinex BID   DVT prophylaxis: Lovenox SQ Code Status: Full code  Family Communication:  Level of care: Med-Surg Dispo:   The patient is from:  ALF Anticipated d/c is to: SNF Anticipated d/c date is: Monday if ALF will accept pt back Patient currently is medically ready to d/c.   Subjective and Interval History:  Pt reported doing ok.     Objective: Vitals:   04/11/21 1128 04/11/21 1642 04/11/21 1643 04/11/21 1645  BP: 111/61 (!) 81/63 (!) 87/61 (!) 90/52  Pulse: 75 86 82 75  Resp: 16 16    Temp: 97.7 F (36.5 C) 97.8 F (36.6 C)    TempSrc:      SpO2: 100% 97% 98% 97%  Weight:      Height:        Intake/Output Summary (Last 24 hours) at 04/11/2021 1752 Last data filed at 04/10/2021 2230 Gross per 24 hour  Intake 60 ml  Output 600 ml  Net -540 ml   Filed Weights   04/08/21 0816  Weight: 81.6 kg    Examination:   Constitutional: NAD, alert, oriented to person and place HEENT: conjunctivae and lids normal, EOMI CV: No cyanosis.   RESP: normal respiratory effort, on RA Extremities: No effusions, edema in BLE SKIN: warm, dry Neuro: II - XII grossly intact.   Psych: depressed mood and affect.     Data Reviewed: I have personally reviewed following labs and imaging studies  CBC: Recent Labs  Lab 04/08/21 0817 04/09/21 0322 04/10/21 0552 04/11/21 0424  WBC 8.0 5.6 7.4 8.0  NEUTROABS 6.6  --   --   --   HGB 15.4 13.9 15.0 14.7  HCT 44.2 40.0 43.1 41.2  MCV 93.1 93.5 94.5 92.0  PLT 160 145* 148* 142*  Basic Metabolic Panel: Recent Labs  Lab 04/08/21 0817 04/09/21 0322 04/10/21 0552 04/11/21 0424  NA 141 140 140 140  K 3.9 4.0 3.8 3.5  CL 108 107 106 107  CO2 24 24 23 24   GLUCOSE 99 95 105* 129*  BUN 17 15 17 20   CREATININE 0.98 0.92 0.93 0.84  CALCIUM 9.1 8.7* 8.6* 8.4*  MG  --   --  2.1 2.1   GFR: Estimated Creatinine Clearance: 80.9 mL/min (by C-G formula based on SCr of 0.84 mg/dL). Liver Function Tests: Recent Labs  Lab 04/08/21 0817  AST 18  ALT 13  ALKPHOS 93  BILITOT 1.5*  PROT 7.1  ALBUMIN 4.2   No results for input(s): LIPASE, AMYLASE in the last 168 hours. No  results for input(s): AMMONIA in the last 168 hours. Coagulation Profile: No results for input(s): INR, PROTIME in the last 168 hours. Cardiac Enzymes: No results for input(s): CKTOTAL, CKMB, CKMBINDEX, TROPONINI in the last 168 hours. BNP (last 3 results) No results for input(s): PROBNP in the last 8760 hours. HbA1C: No results for input(s): HGBA1C in the last 72 hours. CBG: No results for input(s): GLUCAP in the last 168 hours. Lipid Profile: No results for input(s): CHOL, HDL, LDLCALC, TRIG, CHOLHDL, LDLDIRECT in the last 72 hours. Thyroid Function Tests: Recent Labs    04/09/21 0322  TSH 0.921   Anemia Panel: No results for input(s): VITAMINB12, FOLATE, FERRITIN, TIBC, IRON, RETICCTPCT in the last 72 hours. Sepsis Labs: No results for input(s): PROCALCITON, LATICACIDVEN in the last 168 hours.  Recent Results (from the past 240 hour(s))  Resp Panel by RT-PCR (Flu A&B, Covid) Nasopharyngeal Swab     Status: Abnormal   Collection Time: 04/08/21  8:18 AM   Specimen: Nasopharyngeal Swab; Nasopharyngeal(NP) swabs in vial transport medium  Result Value Ref Range Status   SARS Coronavirus 2 by RT PCR POSITIVE (A) NEGATIVE Final    Comment: RESULT CALLED TO, READ BACK BY AND VERIFIED WITH:  BRIANNA CHAPMON AT 1012 04/08/21 SDR (NOTE) SARS-CoV-2 target nucleic acids are DETECTED.  The SARS-CoV-2 RNA is generally detectable in upper respiratory specimens during the acute phase of infection. Positive results are indicative of the presence of the identified virus, but do not rule out bacterial infection or co-infection with other pathogens not detected by the test. Clinical correlation with patient history and other diagnostic information is necessary to determine patient infection status. The expected result is Negative.  Fact Sheet for Patients: 04/10/21  Fact Sheet for Healthcare Providers: 04/10/21  This test  is not yet approved or cleared by the [employer] FDA and  has been authorized for detection and/or diagnosis of SARS-CoV-2 by FDA under an Emergency Use Authorization (EUA).  This EUA will remain in effect (meaning this test can  be used) for the duration of  the COVID-19 declaration under Section 564(b)(1) of the Act, 21 U.S.C. section 360bbb-3(b)(1), unless the authorization is terminated or revoked sooner.     Influenza A by PCR NEGATIVE NEGATIVE Final   Influenza B by PCR NEGATIVE NEGATIVE Final    Comment: (NOTE) The Xpert Xpress SARS-CoV-2/FLU/RSV plus assay is intended as an aid in the diagnosis of influenza from Nasopharyngeal swab specimens and should not be used as a sole basis for treatment. Nasal washings and aspirates are unacceptable for Xpert Xpress SARS-CoV-2/FLU/RSV testing.  Fact Sheet for Patients: SeriousBroker.it  Fact Sheet for Healthcare Providers: [place]  This test is not yet approved or cleared by the [employer]  States FDA and has been authorized for detection and/or diagnosis of SARS-CoV-2 by FDA under an Emergency Use Authorization (EUA). This EUA will remain in effect (meaning this test can be used) for the duration of the COVID-19 declaration under Section 564(b)(1) of the Act, 21 U.S.C. section 360bbb-3(b)(1), unless the authorization is terminated or revoked.  Performed at [facility], [address] [place], [place] [address]   Urine culture     Status: Abnormal   Collection Time: 04/08/21  8:18 AM   Specimen: Urine, Random  Result Value Ref Range Status   Specimen Description   Final    URINE, RANDOM Performed at [facility], [address] [place], [place] [address]    Special Requests   Final    NONE Performed at [facility], [address] [place], [place] [address]    Culture >=100,000 COLONIES/mL PROTEUS MIRABILIS (A)  Final   Report  Status 04/10/2021 FINAL  Final   Organism ID, Bacteria PROTEUS MIRABILIS (A)  Final      Susceptibility   Proteus mirabilis - MIC*    AMPICILLIN <=2 SENSITIVE Sensitive     CEFAZOLIN <=4 SENSITIVE Sensitive     CEFEPIME <=0.12 SENSITIVE Sensitive     CEFTRIAXONE <=0.25 SENSITIVE Sensitive     CIPROFLOXACIN <=0.25 SENSITIVE Sensitive     GENTAMICIN <=1 SENSITIVE Sensitive     IMIPENEM 8 INTERMEDIATE Intermediate     NITROFURANTOIN 128 RESISTANT Resistant     TRIMETH/SULFA <=20 SENSITIVE Sensitive     AMPICILLIN/SULBACTAM <=2 SENSITIVE Sensitive     PIP/TAZO <=4 SENSITIVE Sensitive     * >=100,000 COLONIES/mL PROTEUS MIRABILIS  MRSA PCR Screening     Status: None   Collection Time: 04/08/21 12:43 PM   Specimen: Nasopharyngeal  Result Value Ref Range Status   MRSA by PCR NEGATIVE NEGATIVE Final    Comment:        The GeneXpert MRSA Assay (FDA approved for NASAL specimens only), is one component of a comprehensive MRSA colonization surveillance program. It is not intended to diagnose MRSA infection nor to guide or monitor treatment for MRSA infections. Performed at [facility], [address] [place], [place] [address]       Radiology Studies: No results found.   Scheduled Meds: . (feeding supplement) PROSource Plus  30 mL Oral BID BM  . carbidopa-levodopa  1 tablet Oral TID  . diltiazem  240 mg Oral Daily  . enoxaparin (LOVENOX) injection  40 mg Subcutaneous Q24H  . escitalopram  20 mg Oral Daily  . guaiFENesin  600 mg Oral BID  . mirtazapine  30 mg Oral QHS  . multivitamin with minerals  1 tablet Oral Daily  . polyvinyl alcohol  1 drop Both Eyes BID  . rivastigmine  9.5 mg Transdermal Daily  . sodium chloride flush  3 mL Intravenous Q12H  . Vitamin D (Ergocalciferol)  50,000 Units Oral Weekly   Continuous Infusions: . sodium chloride    . sodium chloride    . cefTRIAXone (ROCEPHIN)  IV 1 g (04/11/21 1057)     LOS: 3 days     Darlin Priestly,  MD [facility] If 7PM-7AM, please contact night-coverage 04/11/2021, 5:52 PM

## 2021-04-12 DIAGNOSIS — R531 Weakness: Secondary | ICD-10-CM | POA: Diagnosis not present

## 2021-04-12 LAB — BASIC METABOLIC PANEL
Anion gap: 9 (ref 5–15)
BUN: 35 mg/dL — ABNORMAL HIGH (ref 8–23)
CO2: 26 mmol/L (ref 22–32)
Calcium: 8.6 mg/dL — ABNORMAL LOW (ref 8.9–10.3)
Chloride: 105 mmol/L (ref 98–111)
Creatinine, Ser: 0.95 mg/dL (ref 0.61–1.24)
GFR, Estimated: >60 mL/min (ref >60)
Glucose, Bld: 109 mg/dL — ABNORMAL HIGH (ref 70–99)
Potassium: 3.7 mmol/L (ref 3.5–5.1)
Sodium: 140 mmol/L (ref 135–145)

## 2021-04-12 LAB — CBC
HCT: 39.6 % (ref 39.0–52.0)
Hemoglobin: 13.4 g/dL (ref 13.0–17.0)
MCH: 32.2 pg (ref 26.0–34.0)
MCHC: 33.8 g/dL (ref 30.0–36.0)
MCV: 95.2 fL (ref 80.0–100.0)
Platelets: 142 10*3/uL — ABNORMAL LOW (ref 150–400)
RBC: 4.16 MIL/uL — ABNORMAL LOW (ref 4.22–5.81)
RDW: 12.7 % (ref 11.5–15.5)
WBC: 6.1 10*3/uL (ref 4.0–10.5)
nRBC: 0 % (ref 0.0–0.2)

## 2021-04-12 LAB — MAGNESIUM: Magnesium: 2.3 mg/dL (ref 1.7–2.4)

## 2021-04-12 NOTE — Progress Notes (Signed)
PROGRESS NOTE    Justin Kim  WUJ:[id number] DOB: February 15, 1946 DOA: 04/08/2021 PCP: Pcp, No  128A/128A-AA   Assessment & Plan:   Principal Problem:   Generalized weakness Active Problems:   Acute lower UTI   Dementia due to Parkinson's disease without behavioral disturbance (HCC)   Parkinson disease (HCC)   Unspecified atrial fibrillation (HCC)   COVID-19 virus detected   Justin Kim is a 75 y.o. male with medical history significant for Parkinson's disease with Lewy body dementia and hypertension who was brought to the ER by EMS for evaluation of weakness and disorientation for about 2 days.  Patient usually ambulates with a rolling walker has been too weak to do that.  No history of recent falls.  His brother-in-law was at the bedside states that he has difficulty with ambulation due to his Parkinson's disease.   Generalized weakness Appears to be multifactorial and related to Parkinson's disease as well as recent UTI. Plan: --PT/OT --pt prefers to return to his previous ALF --ALF to evaluate pt on Monday for suitability of pt returning to ALF  UTI 2/2 proteus mirabilis Patient has significant pyuria, and has dysuria. --started on ceftriaxone Plan: --transition to Keflex today to finish a 7-day course  Parkinson's disease with dementia without behavioral disturbance Continue Sinemet Continue Exelon and Remeron --PT/OT rec SNF rehab  History of A. Fib --occasional RVR not sustained Patient is not on anticoagulation due to increased risk of falls --TSH wnl --cont Cardizem 180 mg daily  COVID-19 virus infection Patient had a screening test on admission and tested positive for COVID-19 virus He is vaccinated. Has a cough productive of sputum, but not hypoxic Plan: --Monitor without COVID tx for now --Mucinex BID   DVT prophylaxis: Lovenox SQ Code Status: Full code  Family Communication:  Level of care: Med-Surg Dispo:   The patient is from:  ALF Anticipated d/c is to: SNF Anticipated d/c date is: Monday if ALF will accept pt back Patient currently is medically ready to d/c.   Subjective and Interval History:  Pt reported his cough improved.  Dysuria also improved.     Objective: Vitals:   04/12/21 0607 04/12/21 0816 04/12/21 1157 04/12/21 1550  BP: 121/72 131/89 104/62 115/79  Pulse: (!) 108 71 61 71  Resp:  18 18 18   Temp: 98.3 F (36.8 C) 97.9 F (36.6 C) 98.2 F (36.8 C) 97.8 F (36.6 C)  TempSrc: Oral     SpO2: 98% 99% 97% 98%  Weight:      Height:        Intake/Output Summary (Last 24 hours) at 04/12/2021 1711 Last data filed at 04/12/2021 04/14/2021 Gross per 24 hour  Intake 60 ml  Output 300 ml  Net -240 ml   Filed Weights   04/08/21 0816  Weight: 81.6 kg    Examination:   Constitutional: NAD, AAOx3 HEENT: conjunctivae and lids normal, EOMI CV: No cyanosis.   RESP: normal respiratory effort, on RA Extremities: No effusions, edema in BLE SKIN: warm, dry Neuro: II - XII grossly intact.   Psych: depressed mood and affect.     Data Reviewed: I have personally reviewed following labs and imaging studies  CBC: Recent Labs  Lab 04/08/21 0817 04/09/21 0322 04/10/21 0552 04/11/21 0424 04/12/21 0320  WBC 8.0 5.6 7.4 8.0 6.1  NEUTROABS 6.6  --   --   --   --   HGB 15.4 13.9 15.0 14.7 13.4  HCT 44.2 40.0 43.1 41.2 39.6  MCV 93.1  93.5 94.5 92.0 95.2  PLT 160 145* 148* 142* 142*   Basic Metabolic Panel: Recent Labs  Lab 04/08/21 0817 04/09/21 0322 04/10/21 0552 04/11/21 0424 04/12/21 0320  NA 141 140 140 140 140  K 3.9 4.0 3.8 3.5 3.7  CL 108 107 106 107 105  CO2 24 24 23 24 26   GLUCOSE 99 95 105* 129* 109*  BUN 17 15 17 20  35*  CREATININE 0.98 0.92 0.93 0.84 0.95  CALCIUM 9.1 8.7* 8.6* 8.4* 8.6*  MG  --   --  2.1 2.1 2.3   GFR: Estimated Creatinine Clearance: 71.6 mL/min (by C-G formula based on SCr of 0.95 mg/dL). Liver Function Tests: Recent Labs  Lab 04/08/21 0817  AST 18   ALT 13  ALKPHOS 93  BILITOT 1.5*  PROT 7.1  ALBUMIN 4.2   No results for input(s): LIPASE, AMYLASE in the last 168 hours. No results for input(s): AMMONIA in the last 168 hours. Coagulation Profile: No results for input(s): INR, PROTIME in the last 168 hours. Cardiac Enzymes: No results for input(s): CKTOTAL, CKMB, CKMBINDEX, TROPONINI in the last 168 hours. BNP (last 3 results) No results for input(s): PROBNP in the last 8760 hours. HbA1C: No results for input(s): HGBA1C in the last 72 hours. CBG: No results for input(s): GLUCAP in the last 168 hours. Lipid Profile: No results for input(s): CHOL, HDL, LDLCALC, TRIG, CHOLHDL, LDLDIRECT in the last 72 hours. Thyroid Function Tests: No results for input(s): TSH, T4TOTAL, FREET4, T3FREE, THYROIDAB in the last 72 hours. Anemia Panel: No results for input(s): VITAMINB12, FOLATE, FERRITIN, TIBC, IRON, RETICCTPCT in the last 72 hours. Sepsis Labs: No results for input(s): PROCALCITON, LATICACIDVEN in the last 168 hours.  Recent Results (from the past 240 hour(s))  Resp Panel by RT-PCR (Flu A&B, Covid) Nasopharyngeal Swab     Status: Abnormal   Collection Time: 04/08/21  8:18 AM   Specimen: Nasopharyngeal Swab; Nasopharyngeal(NP) swabs in vial transport medium  Result Value Ref Range Status   SARS Coronavirus 2 by RT PCR POSITIVE (A) NEGATIVE Final    Comment: RESULT CALLED TO, READ BACK BY AND VERIFIED WITH:  BRIANNA CHAPMON AT 1012 04/08/21 SDR (NOTE) SARS-CoV-2 target nucleic acids are DETECTED.  The SARS-CoV-2 RNA is generally detectable in upper respiratory specimens during the acute phase of infection. Positive results are indicative of the presence of the identified virus, but do not rule out bacterial infection or co-infection with other pathogens not detected by the test. Clinical correlation with patient history and other diagnostic information is necessary to determine patient infection status. The expected result is  Negative.  Fact Sheet for Patients: 04/10/21  Fact Sheet for Healthcare Providers: 04/10/21  This test is not yet approved or cleared by the [employer] FDA and  has been authorized for detection and/or diagnosis of SARS-CoV-2 by FDA under an Emergency Use Authorization (EUA).  This EUA will remain in effect (meaning this test can  be used) for the duration of  the COVID-19 declaration under Section 564(b)(1) of the Act, 21 U.S.C. section 360bbb-3(b)(1), unless the authorization is terminated or revoked sooner.     Influenza A by PCR NEGATIVE NEGATIVE Final   Influenza B by PCR NEGATIVE NEGATIVE Final    Comment: (NOTE) The Xpert Xpress SARS-CoV-2/FLU/RSV plus assay is intended as an aid in the diagnosis of influenza from Nasopharyngeal swab specimens and should not be used as a sole basis for treatment. Nasal washings and aspirates are unacceptable for Xpert Xpress SARS-CoV-2/FLU/RSV  testing.  Fact Sheet for Patients: [employer]gerCourse.com  Fact Sheet for Healthcare Providers: SeriousBroker.it  This test is not yet approved or cleared by the [place] FDA and has been authorized for detection and/or diagnosis of SARS-CoV-2 by FDA under an Emergency Use Authorization (EUA). This EUA will remain in effect (meaning this test can be used) for the duration of the COVID-19 declaration under Section 564(b)(1) of the Act, 21 U.S.C. section 360bbb-3(b)(1), unless the authorization is terminated or revoked.  Performed at [facility], [address] [place], [place] [address]   Urine culture     Status: Abnormal   Collection Time: 04/08/21  8:18 AM   Specimen: Urine, Random  Result Value Ref Range Status   Specimen Description   Final    URINE, RANDOM Performed at [facility], [address] [place], [place] [address]    Special Requests    Final    NONE Performed at [facility], [address] [place], [place] [address]    Culture >=100,000 COLONIES/mL PROTEUS MIRABILIS (A)  Final   Report Status 04/10/2021 FINAL  Final   Organism ID, Bacteria PROTEUS MIRABILIS (A)  Final      Susceptibility   Proteus mirabilis - MIC*    AMPICILLIN <=2 SENSITIVE Sensitive     CEFAZOLIN <=4 SENSITIVE Sensitive     CEFEPIME <=0.12 SENSITIVE Sensitive     CEFTRIAXONE <=0.25 SENSITIVE Sensitive     CIPROFLOXACIN <=0.25 SENSITIVE Sensitive     GENTAMICIN <=1 SENSITIVE Sensitive     IMIPENEM 8 INTERMEDIATE Intermediate     NITROFURANTOIN 128 RESISTANT Resistant     TRIMETH/SULFA <=20 SENSITIVE Sensitive     AMPICILLIN/SULBACTAM <=2 SENSITIVE Sensitive     PIP/TAZO <=4 SENSITIVE Sensitive     * >=100,000 COLONIES/mL PROTEUS MIRABILIS  MRSA PCR Screening     Status: None   Collection Time: 04/08/21 12:43 PM   Specimen: Nasopharyngeal  Result Value Ref Range Status   MRSA by PCR NEGATIVE NEGATIVE Final    Comment:        The GeneXpert MRSA Assay (FDA approved for NASAL specimens only), is one component of a comprehensive MRSA colonization surveillance program. It is not intended to diagnose MRSA infection nor to guide or monitor treatment for MRSA infections. Performed at [facility], [address] [place], [place] [address]       Radiology Studies: No results found.   Scheduled Meds: . (feeding supplement) PROSource Plus  30 mL Oral BID BM  . carbidopa-levodopa  1 tablet Oral TID  . cephALEXin  500 mg Oral BID  . diltiazem  180 mg Oral Daily  . enoxaparin (LOVENOX) injection  40 mg Subcutaneous Q24H  . escitalopram  20 mg Oral Daily  . guaiFENesin  600 mg Oral BID  . mirtazapine  30 mg Oral QHS  . multivitamin with minerals  1 tablet Oral Daily  . polyvinyl alcohol  1 drop Both Eyes BID  . rivastigmine  9.5 mg Transdermal Daily  . sodium chloride flush  3 mL Intravenous Q12H  . Vitamin D  (Ergocalciferol)  50,000 Units Oral Weekly   Continuous Infusions: . sodium chloride    . sodium chloride       LOS: 4 days     Darlin Priestly, MD [facility] If 7PM-7AM, please contact night-coverage 04/12/2021, 5:11 PM

## 2021-04-13 DIAGNOSIS — R531 Weakness: Secondary | ICD-10-CM | POA: Diagnosis not present

## 2021-04-13 LAB — BASIC METABOLIC PANEL
Anion gap: 7 (ref 5–15)
BUN: 29 mg/dL — ABNORMAL HIGH (ref 8–23)
CO2: 27 mmol/L (ref 22–32)
Calcium: 8.5 mg/dL — ABNORMAL LOW (ref 8.9–10.3)
Chloride: 107 mmol/L (ref 98–111)
Creatinine, Ser: 0.95 mg/dL (ref 0.61–1.24)
GFR, Estimated: >60 mL/min (ref >60)
Glucose, Bld: 102 mg/dL — ABNORMAL HIGH (ref 70–99)
Potassium: 3.6 mmol/L (ref 3.5–5.1)
Sodium: 141 mmol/L (ref 135–145)

## 2021-04-13 LAB — CBC
HCT: 40.4 % (ref 39.0–52.0)
Hemoglobin: 14.2 g/dL (ref 13.0–17.0)
MCH: 32.9 pg (ref 26.0–34.0)
MCHC: 35.1 g/dL (ref 30.0–36.0)
MCV: 93.5 fL (ref 80.0–100.0)
Platelets: 152 10*3/uL (ref 150–400)
RBC: 4.32 MIL/uL (ref 4.22–5.81)
RDW: 12.5 % (ref 11.5–15.5)
WBC: 5.6 10*3/uL (ref 4.0–10.5)
nRBC: 0 % (ref 0.0–0.2)

## 2021-04-13 LAB — MAGNESIUM: Magnesium: 2.1 mg/dL (ref 1.7–2.4)

## 2021-04-13 NOTE — Progress Notes (Signed)
Occupational Therapy Treatment Patient Details Name: Justin Kim MRN: [id number] DOB: 1946-02-16 Today's Date: 04/13/2021    History of present illness Pt admitted for generalized weakness with AMS x 2 days. History includes Parkinson's dx, HTN, and lewy body dementia.  Pt COVID+.   OT comments  Justin Kim continues to be limited by generalized weakness and impaired cognition that impacts his safety and independence in self care tasks.  Pt was agreeable to today's session focused on self care engagement, but was limited by fatigue.  Pt declined OOB mobility, stating he was fatigued from recent PT session.  OTR educated pt on benefits of OOB mobility for functional independence and strengthening, but pt unwilling to participate.  OTR provided setup assistance for pt to complete oral hygiene at bedlevel position.  Pt with decreased orientation (to time, situation), as well as difficulty following one-step commands.  OTR provided extended time and multiple verbal cues for pt to follow one-step commands.  Justin Kim will likely continue to benefit from skilled OT services in acute setting to address functional strengthening, cognition, balance, and safety and independence in self care tasks.  SNF remains most appropriate discharge recommendation at this time.   Follow Up Recommendations  SNF    Equipment Recommendations  None recommended by OT    Recommendations for Other Services      Precautions / Restrictions Precautions Precautions: Fall Precaution Comments: COVID/Airborne and contact precautions Restrictions Weight Bearing Restrictions: No       Mobility Bed Mobility Overal bed mobility: Needs Assistance               Patient Response: Flat affect;Cooperative  Transfers Overall transfer level: Needs assistance                    Balance Overall balance assessment: Needs assistance                                         ADL either performed  or assessed with clinical judgement   ADL Overall ADL's : Needs assistance/impaired     Grooming: Oral care;Bed level;Set up Grooming Details (indicate cue type and reason): OTR provided setup assistance for pt to brush teeth at bedlevel                               General ADL Comments: Pt declined OOB mobility, stating he was fatigued after recent PT session.     Vision Patient Visual Report: No change from baseline     Perception     Praxis      Cognition Arousal/Alertness: Lethargic Behavior During Therapy: Flat affect Overall Cognitive Status: History of cognitive impairments - at baseline                                 General Comments: Lethargic, some disorientation noted, requires extended time and multiple verbal cues to follow one-step commands        Exercises Other Exercises Other Exercises: Provided setup assist for oral hygiene, educated pt on importance of OOB mobility and therapeutic activity to promote functional strengthening and independence   Shoulder Instructions       General Comments      Pertinent Vitals/ Pain       Pain Assessment: No/denies pain  Home Living                                          Prior Functioning/Environment              Frequency  Min 1X/week        Progress Toward Goals  OT Goals(current goals can now be found in the care plan section)  Progress towards OT goals: Progressing toward goals  Acute Rehab OT Goals Patient Stated Goal: to get back to ALF OT Goal Formulation: With patient Time For Goal Achievement: 04/23/21 Potential to Achieve Goals: Fair  Plan Discharge plan remains appropriate;Frequency remains appropriate    Co-evaluation                 AM-PAC OT "6 Clicks" Daily Activity     Outcome Measure   Help from another person eating meals?: A Lot Help from another person taking care of personal grooming?: A Little Help from another  person toileting, which includes using toliet, bedpan, or urinal?: A Lot Help from another person bathing (including washing, rinsing, drying)?: A Lot Help from another person to put on and taking off regular upper body clothing?: A Lot Help from another person to put on and taking off regular lower body clothing?: A Lot 6 Click Score: 13    End of Session    OT Visit Diagnosis: Unsteadiness on feet (R26.81);Muscle weakness (generalized) (M62.81);Feeding difficulties (R63.3);Other symptoms and signs involving cognitive function   Activity Tolerance Patient limited by fatigue   Patient Left in bed;with call bell/phone within reach;with bed alarm set   Nurse Communication          Time: 1430-1445 OT Time Calculation (min): 15 min  Charges: OT General Charges $OT Visit: 1 Visit OT Treatments $Self Care/Home Management : 8-22 mins  Kathyrn Drown Oretta Berkland, OTR/L 04/13/21, 3:44 PM

## 2021-04-13 NOTE — Care Management Important Message (Signed)
Important Message  Patient Details  Name: Justin Kim MRN: [id number] Date of Birth: Nov 18, 1946   Medicare Important Message Given:  Yes  Contacted HCPOA, [name], B-I-L at [phone] and reviewed the Important Message once again.  He is in agreement with the discharge plan.  I asked if he would like another copy of the form and he replied no as he has the one I sent last Friday in a SECURE e-mail.  I thanked him for his time.  Olegario Messier A Mry Lamia 04/13/2021, 2:20 PM

## 2021-04-13 NOTE — TOC Progression Note (Signed)
Transition of Care [facility]) - Progression Note    Patient Details  Name: Stephano Arrants MRN: [id number] Date of Birth: 10-01-1946  Transition of [facility]) CM/SW Contact  Allayne Butcher, RN Phone Number: 04/13/2021, 2:34 PM  Clinical Narrative:    Representative from [facility] came by today to assess patient.  [facility] has determined that in the patient's current state they cannot take him back without him first going to rehab.  Patient's sister is aware of need for rehab.  She has no preference in facility.     Expected Discharge Plan: Assisted Living Barriers to Discharge: Continued Medical Work up  Expected Discharge Plan and Services Expected Discharge Plan: Assisted Living       Living arrangements for the past 2 months: Assisted Living Facility                                       Social Determinants of Health (SDOH) Interventions    Readmission Risk Interventions No flowsheet data found.

## 2021-04-13 NOTE — Progress Notes (Signed)
Physical Therapy Treatment Patient Details Name: Justin Kim MRN: [id number] DOB: Sep 16, 1946 Today's Date: 04/13/2021    History of Present Illness Pt admitted for generalized weakness with AMS x 2 days. History includes Parkinson's dx, HTN, and lewy body dementia.  Pt COVID+.    PT Comments    Pt is making gradual progress towards goals. Still remains confused with poor task initiation. Able to sit at EOB, however still unable to stand even with assist. May be canidiate for lift equipment. Heavy posterior leaning and resistance to standing attempts. Good endurance with seated there-ex. Will continue to progress as able.  Follow Up Recommendations  SNF     Equipment Recommendations  None recommended by PT    Recommendations for Other Services       Precautions / Restrictions Precautions Precautions: Fall Precaution Comments: COVID/Airborne and contact precautions Restrictions Weight Bearing Restrictions: No    Mobility  Bed Mobility Overal bed mobility: Needs Assistance Bed Mobility: Supine to Sit     Supine to sit: Mod assist Sit to supine: Max assist   General bed mobility comments: needs mod assist for initiation. Poor trunk control. Once seated, able to decrease assist to cga. Able to maintain for extended time.When returning to bed, needs max assist and assist for repositioning    Transfers Overall transfer level: Needs assistance               General transfer comment: 3 attempts to stand including with and without RW. Unable to lift buttock off bed. Would benefit from sit<>stand lift. lateral transfers performed with total assist up towards [facility] with bed in trenden position.  Ambulation/Gait             General Gait Details: unable   Stairs             Wheelchair Mobility    Modified Rankin (Stroke Patients Only)       Balance Overall balance assessment: Needs assistance Sitting-balance support: Feet supported;Bilateral upper  extremity supported Sitting balance-Leahy Scale: Fair Sitting balance - Comments: abl to maintain improved seated balance for extended time. Needs cga close supervision for safety                                    Cognition Arousal/Alertness: Awake/alert Behavior During Therapy: Flat affect Overall Cognitive Status: History of cognitive impairments - at baseline                                 General Comments: alert, requires extended time for task initiation      Exercises Other Exercises Other Exercises: Seated ther-ex performed on B LE including LAQ, alt. marching, UE reaching. All ther-ex performed x 10 reps and cga. Cues for attention to task Other Exercises: Provided setup assist for oral hygiene, educated pt on importance of OOB mobility and therapeutic activity to promote functional strengthening and independence    General Comments        Pertinent Vitals/Pain Pain Assessment: No/denies pain    Home Living                      Prior Function            PT Goals (current goals can now be found in the care plan section) Acute Rehab PT Goals Patient Stated Goal: to get back to  ALF PT Goal Formulation: Patient unable to participate in goal setting Time For Goal Achievement: 04/22/21 Potential to Achieve Goals: Fair Progress towards PT goals: Progressing toward goals    Frequency    Min 2X/week      PT Plan Current plan remains appropriate    Co-evaluation              AM-PAC PT "6 Clicks" Mobility   Outcome Measure  Help needed turning from your back to your side while in a flat bed without using bedrails?: A Lot Help needed moving from lying on your back to sitting on the side of a flat bed without using bedrails?: A Lot Help needed moving to and from a bed to a chair (including a wheelchair)?: Total Help needed standing up from a chair using your arms (e.g., wheelchair or bedside chair)?: Total Help needed  to walk in hospital room?: Total Help needed climbing 3-5 steps with a railing? : Total 6 Click Score: 8    End of Session Equipment Utilized During Treatment: Gait belt Activity Tolerance: Patient tolerated treatment well Patient left: in bed;with bed alarm set Nurse Communication: Mobility status PT Visit Diagnosis: Muscle weakness (generalized) (M62.81);Difficulty in walking, not elsewhere classified (R26.2)     Time: 4401-0272 PT Time Calculation (min) (ACUTE ONLY): 44 min  Charges:  $Therapeutic Exercise: 23-37 mins $Therapeutic Activity: 8-22 mins                     Elizabeth Palau, PT, DPT [phone]    Justin Kim 04/13/2021, 4:17 PM

## 2021-04-13 NOTE — Progress Notes (Signed)
PROGRESS NOTE    Justin Kim  BHA:[id number] DOB: 26-Nov-1946 DOA: 04/08/2021 PCP: Pcp, No  128A/128A-AA   Assessment & Plan:   Principal Problem:   Generalized weakness Active Problems:   Acute lower UTI   Dementia due to Parkinson's disease without behavioral disturbance (HCC)   Parkinson disease (HCC)   Unspecified atrial fibrillation (HCC)   COVID-19 virus detected   Justin Kim is a 75 y.o. male with medical history significant for Parkinson's disease with Lewy body dementia and hypertension who was brought to the ER by EMS for evaluation of weakness and disorientation for about 2 days.  Patient usually ambulates with a rolling walker has been too weak to do that.  No history of recent falls.  His brother-in-law was at the bedside states that he has difficulty with ambulation due to his Parkinson's disease.   Generalized weakness Appears to be multifactorial and related to Parkinson's disease as well as recent UTI. Plan: --PT/OT rec SNF rehab  UTI 2/2 proteus mirabilis Patient has significant pyuria, and has dysuria. --started on ceftriaxone, transitioned to Keflex per sentivity Plan: --cont keflex to finish 7-day course, last day 4/19  Parkinson's disease with dementia without behavioral disturbance Continue Sinemet Continue Exelon and Remeron --PT/OT rec SNF rehab  History of A. Fib --occasional RVR not sustained Patient is not on anticoagulation due to increased risk of falls --TSH wnl --cont cardizem as 180 mg daily  COVID-19 virus infection Patient had a screening test on admission and tested positive for COVID-19 virus He is vaccinated. Has a cough productive of sputum, but not hypoxic Plan: --Monitor without COVID tx for now --Mucinex BID   DVT prophylaxis: Lovenox SQ Code Status: Full code  Family Communication:  Level of care: Med-Surg Dispo:   The patient is from: ALF Anticipated d/c is to: SNF Anticipated d/c date is: whenever bed  available, and need to wait for the 10-day COVID isolation period is over.  Patient currently is medically ready to d/c.   Subjective and Interval History:  Normal oral intake.  Respiratory status stable.  ALF staff came to eval and found pt needing SNF care prior to returning to ALF.   Objective: Vitals:   04/13/21 0029 04/13/21 0608 04/13/21 0827 04/13/21 1237  BP: 122/90 129/79 133/77   Pulse: 73 72 77 73  Resp: 16 16 18 16   Temp: 98.5 F (36.9 C) 98.4 F (36.9 C) 98.3 F (36.8 C) 98 F (36.7 C)  TempSrc: Oral Oral Oral   SpO2: 96% 98% 98% (!) 81%  Weight:      Height:        Intake/Output Summary (Last 24 hours) at 04/13/2021 1515 Last data filed at 04/13/2021 0500 Gross per 24 hour  Intake --  Output 475 ml  Net -475 ml   Filed Weights   04/08/21 0816  Weight: 81.6 kg    Examination:   Constitutional: NAD, AAOx3 HEENT: conjunctivae and lids normal, EOMI CV: No cyanosis.   RESP: some gurgling, normal respiratory effort, on RA Extremities: No effusions, edema in BLE SKIN: warm, dry Neuro: II - XII grossly intact.   Psych: depressed mood and affect.     Data Reviewed: I have personally reviewed following labs and imaging studies  CBC: Recent Labs  Lab 04/08/21 0817 04/09/21 0322 04/10/21 0552 04/11/21 0424 04/12/21 0320 04/13/21 0319  WBC 8.0 5.6 7.4 8.0 6.1 5.6  NEUTROABS 6.6  --   --   --   --   --  HGB 15.4 13.9 15.0 14.7 13.4 14.2  HCT 44.2 40.0 43.1 41.2 39.6 40.4  MCV 93.1 93.5 94.5 92.0 95.2 93.5  PLT 160 145* 148* 142* 142* 152   Basic Metabolic Panel: Recent Labs  Lab 04/09/21 0322 04/10/21 0552 04/11/21 0424 04/12/21 0320 04/13/21 0319  NA 140 140 140 140 141  K 4.0 3.8 3.5 3.7 3.6  CL 107 106 107 105 107  CO2 24 23 24 26 27   GLUCOSE 95 105* 129* 109* 102*  BUN 15 17 20  35* 29*  CREATININE 0.92 0.93 0.84 0.95 0.95  CALCIUM 8.7* 8.6* 8.4* 8.6* 8.5*  MG  --  2.1 2.1 2.3 2.1   GFR: Estimated Creatinine Clearance: 71.6  mL/min (by C-G formula based on SCr of 0.95 mg/dL). Liver Function Tests: Recent Labs  Lab 04/08/21 0817  AST 18  ALT 13  ALKPHOS 93  BILITOT 1.5*  PROT 7.1  ALBUMIN 4.2   No results for input(s): LIPASE, AMYLASE in the last 168 hours. No results for input(s): AMMONIA in the last 168 hours. Coagulation Profile: No results for input(s): INR, PROTIME in the last 168 hours. Cardiac Enzymes: No results for input(s): CKTOTAL, CKMB, CKMBINDEX, TROPONINI in the last 168 hours. BNP (last 3 results) No results for input(s): PROBNP in the last 8760 hours. HbA1C: No results for input(s): HGBA1C in the last 72 hours. CBG: No results for input(s): GLUCAP in the last 168 hours. Lipid Profile: No results for input(s): CHOL, HDL, LDLCALC, TRIG, CHOLHDL, LDLDIRECT in the last 72 hours. Thyroid Function Tests: No results for input(s): TSH, T4TOTAL, FREET4, T3FREE, THYROIDAB in the last 72 hours. Anemia Panel: No results for input(s): VITAMINB12, FOLATE, FERRITIN, TIBC, IRON, RETICCTPCT in the last 72 hours. Sepsis Labs: No results for input(s): PROCALCITON, LATICACIDVEN in the last 168 hours.  Recent Results (from the past 240 hour(s))  Resp Panel by RT-PCR (Flu A&B, Covid) Nasopharyngeal Swab     Status: Abnormal   Collection Time: 04/08/21  8:18 AM   Specimen: Nasopharyngeal Swab; Nasopharyngeal(NP) swabs in vial transport medium  Result Value Ref Range Status   SARS Coronavirus 2 by RT PCR POSITIVE (A) NEGATIVE Final    Comment: RESULT CALLED TO, READ BACK BY AND VERIFIED WITH:  BRIANNA CHAPMON AT 1012 04/08/21 SDR (NOTE) SARS-CoV-2 target nucleic acids are DETECTED.  The SARS-CoV-2 RNA is generally detectable in upper respiratory specimens during the acute phase of infection. Positive results are indicative of the presence of the identified virus, but do not rule out bacterial infection or co-infection with other pathogens not detected by the test. Clinical correlation with patient  history and other diagnostic information is necessary to determine patient infection status. The expected result is Negative.  Fact Sheet for Patients: 04/10/21  Fact Sheet for Healthcare Providers: 04/10/21  This test is not yet approved or cleared by the [employer] FDA and  has been authorized for detection and/or diagnosis of SARS-CoV-2 by FDA under an Emergency Use Authorization (EUA).  This EUA will remain in effect (meaning this test can  be used) for the duration of  the COVID-19 declaration under Section 564(b)(1) of the Act, 21 U.S.C. section 360bbb-3(b)(1), unless the authorization is terminated or revoked sooner.     Influenza A by PCR NEGATIVE NEGATIVE Final   Influenza B by PCR NEGATIVE NEGATIVE Final    Comment: (NOTE) The Xpert Xpress SARS-CoV-2/FLU/RSV plus assay is intended as an aid in the diagnosis of influenza from Nasopharyngeal swab specimens and should not  be used as a sole basis for treatment. Nasal washings and aspirates are unacceptable for Xpert Xpress SARS-CoV-2/FLU/RSV testing.  Fact Sheet for Patients: [employer]  Fact Sheet for Healthcare Providers: [employer]it  This test is not yet approved or cleared by the [place] FDA and has been authorized for detection and/or diagnosis of SARS-CoV-2 by FDA under an Emergency Use Authorization (EUA). This EUA will remain in effect (meaning this test can be used) for the duration of the COVID-19 declaration under Section 564(b)(1) of the Act, 21 U.S.C. section 360bbb-3(b)(1), unless the authorization is terminated or revoked.  Performed at [facility], [address] [place], [place] [address]   Urine culture     Status: Abnormal   Collection Time: 04/08/21  8:18 AM   Specimen: Urine, Random  Result Value Ref Range Status   Specimen Description   Final     URINE, RANDOM Performed at [facility], [address] [place], [place] [address]    Special Requests   Final    NONE Performed at [facility], [address] [place], [place] [address]    Culture >=100,000 COLONIES/mL PROTEUS MIRABILIS (A)  Final   Report Status 04/10/2021 FINAL  Final   Organism ID, Bacteria PROTEUS MIRABILIS (A)  Final      Susceptibility   Proteus mirabilis - MIC*    AMPICILLIN <=2 SENSITIVE Sensitive     CEFAZOLIN <=4 SENSITIVE Sensitive     CEFEPIME <=0.12 SENSITIVE Sensitive     CEFTRIAXONE <=0.25 SENSITIVE Sensitive     CIPROFLOXACIN <=0.25 SENSITIVE Sensitive     GENTAMICIN <=1 SENSITIVE Sensitive     IMIPENEM 8 INTERMEDIATE Intermediate     NITROFURANTOIN 128 RESISTANT Resistant     TRIMETH/SULFA <=20 SENSITIVE Sensitive     AMPICILLIN/SULBACTAM <=2 SENSITIVE Sensitive     PIP/TAZO <=4 SENSITIVE Sensitive     * >=100,000 COLONIES/mL PROTEUS MIRABILIS  MRSA PCR Screening     Status: None   Collection Time: 04/08/21 12:43 PM   Specimen: Nasopharyngeal  Result Value Ref Range Status   MRSA by PCR NEGATIVE NEGATIVE Final    Comment:        The GeneXpert MRSA Assay (FDA approved for NASAL specimens only), is one component of a comprehensive MRSA colonization surveillance program. It is not intended to diagnose MRSA infection nor to guide or monitor treatment for MRSA infections. Performed at [facility], [address] [place], [place] [address]       Radiology Studies: No results found.   Scheduled Meds: . (feeding supplement) PROSource Plus  30 mL Oral BID BM  . carbidopa-levodopa  1 tablet Oral TID  . cephALEXin  500 mg Oral BID  . diltiazem  180 mg Oral Daily  . enoxaparin (LOVENOX) injection  40 mg Subcutaneous Q24H  . escitalopram  20 mg Oral Daily  . guaiFENesin  600 mg Oral BID  . mirtazapine  30 mg Oral QHS  . multivitamin with minerals  1 tablet Oral Daily  . polyvinyl alcohol  1 drop Both  Eyes BID  . rivastigmine  9.5 mg Transdermal Daily  . sodium chloride flush  3 mL Intravenous Q12H  . Vitamin D (Ergocalciferol)  50,000 Units Oral Weekly   Continuous Infusions: . sodium chloride    . sodium chloride       LOS: 5 days     Darlin Priestly, MD [facility] If 7PM-7AM, please contact night-coverage 04/13/2021, 3:15 PM

## 2021-04-13 NOTE — NC FL2 (Signed)
[place] MEDICAID FL2 LEVEL OF CARE SCREENING TOOL     IDENTIFICATION  Patient Name: Justin Kim Birthdate: [birth year] Sex: male Admission Date (Current Location): 04/08/2021  [facility] and [place] Number:  Chiropodist and Address:  [facility], [address], [place], [place] [address]      Provider Number: [id number]  Attending Physician Name and Address:  Darlin Priestly, MD  Relative Name and Phone Number:       Current Level of Care: SNF Recommended Level of Care: Skilled Nursing Facility Prior Approval Number:    Date Approved/Denied:   PASRR Number:    Discharge Plan: SNF    Current Diagnoses: Patient Active Problem List   Diagnosis Date Noted  . Generalized weakness 04/08/2021  . Acute lower UTI 04/08/2021  . Dementia due to Parkinson's disease without behavioral disturbance (HCC) 04/08/2021  . Parkinson disease (HCC) 04/08/2021  . Unspecified atrial fibrillation (HCC) 04/08/2021  . COVID-19 virus detected 04/08/2021    Orientation RESPIRATION BLADDER Height & Weight     Self,Time  Normal Continent Weight: 81.6 kg Height:  5\' 11"  (180.3 cm)  BEHAVIORAL SYMPTOMS/MOOD NEUROLOGICAL BOWEL NUTRITION STATUS      Continent Diet  AMBULATORY STATUS COMMUNICATION OF NEEDS Skin   Total Care Verbally Normal                       Personal Care Assistance Level of Assistance  Bathing,Feeding,Dressing Bathing Assistance: Maximum assistance Feeding assistance: Maximum assistance Dressing Assistance: Maximum assistance     Functional Limitations Info             SPECIAL CARE FACTORS FREQUENCY  PT (By licensed PT),OT (By licensed OT)     PT Frequency: min 5xweek OT Frequency: min 5xweek            Contractures      Additional Factors Info                  Current Medications (04/13/2021):  This is the current hospital active medication list Current Facility-Administered Medications  Medication Dose Route  Frequency Provider Last Rate Last Admin  . (feeding supplement) PROSource Plus liquid 30 mL  30 mL Oral BID BM 04/15/2021, MD   30 mL at 04/13/21 1425  . 0.9 %  sodium chloride infusion  250 mL Intravenous PRN Agbata, Tochukwu, MD      . 0.9 %  sodium chloride infusion   Intravenous PRN 04/15/21, MD      . acetaminophen (TYLENOL) tablet 650 mg  650 mg Oral Q6H PRN Agbata, Tochukwu, MD       Or  . acetaminophen (TYLENOL) suppository 650 mg  650 mg Rectal Q6H PRN Agbata, Tochukwu, MD      . carbidopa-levodopa (SINEMET IR) 25-100 MG per tablet immediate release 1 tablet  1 tablet Oral TID Agbata, Tochukwu, MD   1 tablet at 04/13/21 1034  . cephALEXin (KEFLEX) capsule 500 mg  500 mg Oral BID 04/15/21, MD   500 mg at 04/13/21 1033  . diltiazem (CARDIZEM CD) 24 hr capsule 180 mg  180 mg Oral Daily 04/15/21, MD   180 mg at 04/13/21 1033  . enoxaparin (LOVENOX) injection 40 mg  40 mg Subcutaneous Q24H Agbata, Tochukwu, MD   40 mg at 04/13/21 1242  . escitalopram (LEXAPRO) tablet 20 mg  20 mg Oral Daily Agbata, Tochukwu, MD   20 mg at 04/13/21 1034  . guaiFENesin (MUCINEX) 12  hr tablet 600 mg  600 mg Oral BID Darlin Priestly, MD   600 mg at 04/13/21 1034  . mirtazapine (REMERON) tablet 30 mg  30 mg Oral QHS Agbata, Tochukwu, MD   30 mg at 04/12/21 2129  . multivitamin with minerals tablet 1 tablet  1 tablet Oral Daily Darlin Priestly, MD   1 tablet at 04/13/21 1034  . ondansetron (ZOFRAN) tablet 4 mg  4 mg Oral Q6H PRN Agbata, Tochukwu, MD       Or  . ondansetron (ZOFRAN) injection 4 mg  4 mg Intravenous Q6H PRN Agbata, Tochukwu, MD      . polyvinyl alcohol (LIQUIFILM TEARS) 1.4 % ophthalmic solution 1 drop  1 drop Both Eyes BID Agbata, Tochukwu, MD   1 drop at 04/13/21 1034  . rivastigmine (EXELON) 9.5 mg/24hr 9.5 mg  9.5 mg Transdermal Daily Agbata, Tochukwu, MD   9.5 mg at 04/13/21 1035  . sodium chloride flush (NS) 0.9 % injection 3 mL  3 mL Intravenous Q12H Agbata, Tochukwu, MD   3 mL at 04/13/21 1035  .  sodium chloride flush (NS) 0.9 % injection 3 mL  3 mL Intravenous PRN Agbata, Tochukwu, MD      . Vitamin D (Ergocalciferol) (DRISDOL) capsule 50,000 Units  50,000 Units Oral Weekly Agbata, Tochukwu, MD   50,000 Units at 04/09/21 1059     Discharge Medications: Please see discharge summary for a list of discharge medications.  Relevant Imaging Results:  Relevant Lab Results:   Additional Information CB#[id number]  Holley Cellar, RN

## 2021-04-14 ENCOUNTER — Encounter: Payer: Self-pay | Admitting: Internal Medicine

## 2021-04-14 DIAGNOSIS — R531 Weakness: Secondary | ICD-10-CM | POA: Diagnosis not present

## 2021-04-14 MED ORDER — BENZONATATE 100 MG PO CAPS: 100.0000 mg | ORAL_CAPSULE | Freq: Two times a day (BID) | ORAL | Status: DC | PRN

## 2021-04-14 MED ORDER — DM-GUAIFENESIN ER 30-600 MG PO TB12
1.0000 | ORAL_TABLET | Freq: Two times a day (BID) | ORAL | Status: DC
  Administered 2021-04-14 – 2021-04-20 (×12): 1 via ORAL
  Filled 2021-04-14 (×12): qty 1

## 2021-04-14 NOTE — Progress Notes (Signed)
PROGRESS NOTE    Justin Kim  YCX:[id number] DOB: 1946-03-16 DOA: 04/08/2021 PCP: Oneita Hurt, No  128A/128A-AA  Justin Kim is a 75 y.o. male with medical history significant for Parkinson's disease with Lewy body dementia and hypertension who was brought to the ER by EMS for evaluation of weakness and disorientation for about 2 days.  Patient usually ambulates with a rolling walker has been too weak to do that.  No history of recent falls.  His brother-in-law was at the bedside states that he has difficulty with ambulation due to his Parkinson's disease. PT recommending SNF- need to wait 4 more days to be out of COVID isolation before moving to SNF.  Assessment & Plan:   Principal Problem:   Generalized weakness Active Problems:   Acute lower UTI   Dementia due to Parkinson's disease without behavioral disturbance (HCC)   Parkinson disease (HCC)   Unspecified atrial fibrillation (HCC)   COVID-19 virus detected    Generalized weakness Appears to be multifactorial and related to Parkinson's disease as well as recent UTI. Plan: --PT/OT rec SNF rehab.    UTI 2/2 proteus mirabilis Patient has significant pyuria, and has dysuria. --started on ceftriaxone, transitioned to Keflex per sentivit, will complete the course today  Parkinson's disease with dementia without behavioral disturbance Continue Sinemet Continue Exelon and Remeron --PT/OT rec SNF rehab  History of A. Fib --occasional RVR not sustained Patient is not on anticoagulation due to increased risk of falls --TSH wnl --cont cardizem as 180 mg daily  COVID-19 virus infection Patient had a screening test on admission and tested positive for COVID-19 virus He is vaccinated. Has a cough productive of sputum, but not hypoxic Plan: --Monitor without COVID tx for now --Mucinex BID -Add Tessalon Perles   DVT prophylaxis: Lovenox SQ Code Status: Full code  Family Communication:  Level of care: Med-Surg Dispo:   The  patient is from: ALF Anticipated d/c is to: SNF Anticipated d/c date is: whenever bed available, and need to wait for the 10-day COVID isolation period is over.  Patient currently is medically ready to d/c.   Subjective and Interval History:  Patient continues to have some cough which increases with deep breathing.  Chest is clear.  Objective: Vitals:   04/13/21 2140 04/14/21 0235 04/14/21 0637 04/14/21 0930  BP: 113/75 116/67 (!) 143/85 (!) 147/91  Pulse: 79 65 76 75  Resp: 16 18 16 16   Temp: 98.9 F (37.2 C) 98.9 F (37.2 C) 97.9 F (36.6 C) 98.4 F (36.9 C)  TempSrc: Oral Oral Oral   SpO2: 97% 99% 100% 99%  Weight:      Height:        Intake/Output Summary (Last 24 hours) at 04/14/2021 1441 Last data filed at 04/14/2021 0640 Gross per 24 hour  Intake --  Output 300 ml  Net -300 ml   Filed Weights   04/08/21 0816  Weight: 81.6 kg    Examination:   General.  In no acute distress. Pulmonary.  Lungs clear bilaterally, normal respiratory effort. CV.  Regular rate and rhythm, no JVD, rub or murmur. Abdomen.  Soft, nontender, nondistended, BS positive. CNS.  Alert and oriented x3.  No focal neurologic deficit. Extremities.  No edema, no cyanosis, pulses intact and symmetrical. Psychiatry.  Judgment and insight appears normal.  Data Reviewed: I have personally reviewed following labs and imaging studies  CBC: Recent Labs  Lab 04/08/21 0817 04/09/21 0322 04/10/21 0552 04/11/21 0424 04/12/21 0320 04/13/21 0319  WBC 8.0 5.6  7.4 8.0 6.1 5.6  NEUTROABS 6.6  --   --   --   --   --   HGB 15.4 13.9 15.0 14.7 13.4 14.2  HCT 44.2 40.0 43.1 41.2 39.6 40.4  MCV 93.1 93.5 94.5 92.0 95.2 93.5  PLT 160 145* 148* 142* 142* 152   Basic Metabolic Panel: Recent Labs  Lab 04/09/21 0322 04/10/21 0552 04/11/21 0424 04/12/21 0320 04/13/21 0319  NA 140 140 140 140 141  K 4.0 3.8 3.5 3.7 3.6  CL 107 106 107 105 107  CO2 24 23 24 26 27   GLUCOSE 95 105* 129* 109* 102*   BUN 15 17 20  35* 29*  CREATININE 0.92 0.93 0.84 0.95 0.95  CALCIUM 8.7* 8.6* 8.4* 8.6* 8.5*  MG  --  2.1 2.1 2.3 2.1   GFR: Estimated Creatinine Clearance: 71.6 mL/min (by C-G formula based on SCr of 0.95 mg/dL). Liver Function Tests: Recent Labs  Lab 04/08/21 0817  AST 18  ALT 13  ALKPHOS 93  BILITOT 1.5*  PROT 7.1  ALBUMIN 4.2   No results for input(s): LIPASE, AMYLASE in the last 168 hours. No results for input(s): AMMONIA in the last 168 hours. Coagulation Profile: No results for input(s): INR, PROTIME in the last 168 hours. Cardiac Enzymes: No results for input(s): CKTOTAL, CKMB, CKMBINDEX, TROPONINI in the last 168 hours. BNP (last 3 results) No results for input(s): PROBNP in the last 8760 hours. HbA1C: No results for input(s): HGBA1C in the last 72 hours. CBG: No results for input(s): GLUCAP in the last 168 hours. Lipid Profile: No results for input(s): CHOL, HDL, LDLCALC, TRIG, CHOLHDL, LDLDIRECT in the last 72 hours. Thyroid Function Tests: No results for input(s): TSH, T4TOTAL, FREET4, T3FREE, THYROIDAB in the last 72 hours. Anemia Panel: No results for input(s): VITAMINB12, FOLATE, FERRITIN, TIBC, IRON, RETICCTPCT in the last 72 hours. Sepsis Labs: No results for input(s): PROCALCITON, LATICACIDVEN in the last 168 hours.  Recent Results (from the past 240 hour(s))  Resp Panel by RT-PCR (Flu A&B, Covid) Nasopharyngeal Swab     Status: Abnormal   Collection Time: 04/08/21  8:18 AM   Specimen: Nasopharyngeal Swab; Nasopharyngeal(NP) swabs in vial transport medium  Result Value Ref Range Status   SARS Coronavirus 2 by RT PCR POSITIVE (A) NEGATIVE Final    Comment: RESULT CALLED TO, READ BACK BY AND VERIFIED WITH:  BRIANNA CHAPMON AT 1012 04/08/21 SDR (NOTE) SARS-CoV-2 target nucleic acids are DETECTED.  The SARS-CoV-2 RNA is generally detectable in upper respiratory specimens during the acute phase of infection. Positive results are indicative of the  presence of the identified virus, but do not rule out bacterial infection or co-infection with other pathogens not detected by the test. Clinical correlation with patient history and other diagnostic information is necessary to determine patient infection status. The expected result is Negative.  Fact Sheet for Patients: BloggerCourse.comhttps://www.fda.gov/media/152166/download  Fact Sheet for Healthcare Providers: SeriousBroker.ithttps://www.fda.gov/media/152162/download  This test is not yet approved or cleared by the [place] FDA and  has been authorized for detection and/or diagnosis of SARS-CoV-2 by FDA under an Emergency Use Authorization (EUA).  This EUA will remain in effect (meaning this test can  be used) for the duration of  the COVID-19 declaration under Section 564(b)(1) of the Act, 21 U.S.C. section 360bbb-3(b)(1), unless the authorization is terminated or revoked sooner.     Influenza A by PCR NEGATIVE NEGATIVE Final   Influenza B by PCR NEGATIVE NEGATIVE Final    Comment: (NOTE)  The Xpert Xpress SARS-CoV-2/FLU/RSV plus assay is intended as an aid in the diagnosis of influenza from Nasopharyngeal swab specimens and should not be used as a sole basis for treatment. Nasal washings and aspirates are unacceptable for Xpert Xpress SARS-CoV-2/FLU/RSV testing.  Fact Sheet for Patients: BloggerCourse.com  Fact Sheet for Healthcare Providers: SeriousBroker.it  This test is not yet approved or cleared by the [place] FDA and has been authorized for detection and/or diagnosis of SARS-CoV-2 by FDA under an Emergency Use Authorization (EUA). This EUA will remain in effect (meaning this test can be used) for the duration of the COVID-19 declaration under Section 564(b)(1) of the Act, 21 U.S.C. section 360bbb-3(b)(1), unless the authorization is terminated or revoked.  Performed at [facility], [address] [place], [place]  [address]   Urine culture     Status: Abnormal   Collection Time: 04/08/21  8:18 AM   Specimen: Urine, Random  Result Value Ref Range Status   Specimen Description   Final    URINE, RANDOM Performed at [facility], [address] [place], [place] [address]    Special Requests   Final    NONE Performed at [facility], [address] [place], [place] [address]    Culture >=100,000 COLONIES/mL PROTEUS MIRABILIS (A)  Final   Report Status 04/10/2021 FINAL  Final   Organism ID, Bacteria PROTEUS MIRABILIS (A)  Final      Susceptibility   Proteus mirabilis - MIC*    AMPICILLIN <=2 SENSITIVE Sensitive     CEFAZOLIN <=4 SENSITIVE Sensitive     CEFEPIME <=0.12 SENSITIVE Sensitive     CEFTRIAXONE <=0.25 SENSITIVE Sensitive     CIPROFLOXACIN <=0.25 SENSITIVE Sensitive     GENTAMICIN <=1 SENSITIVE Sensitive     IMIPENEM 8 INTERMEDIATE Intermediate     NITROFURANTOIN 128 RESISTANT Resistant     TRIMETH/SULFA <=20 SENSITIVE Sensitive     AMPICILLIN/SULBACTAM <=2 SENSITIVE Sensitive     PIP/TAZO <=4 SENSITIVE Sensitive     * >=100,000 COLONIES/mL PROTEUS MIRABILIS  MRSA PCR Screening     Status: None   Collection Time: 04/08/21 12:43 PM   Specimen: Nasopharyngeal  Result Value Ref Range Status   MRSA by PCR NEGATIVE NEGATIVE Final    Comment:        The GeneXpert MRSA Assay (FDA approved for NASAL specimens only), is one component of a comprehensive MRSA colonization surveillance program. It is not intended to diagnose MRSA infection nor to guide or monitor treatment for MRSA infections. Performed at [facility], [address], [place], [place] [address]       Radiology Studies: No results found.   Scheduled Meds: . (feeding supplement) PROSource Plus  30 mL Oral BID BM  . carbidopa-levodopa  1 tablet Oral TID  . cephALEXin  500 mg Oral BID  . diltiazem  180 mg Oral Daily  . enoxaparin (LOVENOX) injection  40 mg Subcutaneous Q24H  .  escitalopram  20 mg Oral Daily  . guaiFENesin  600 mg Oral BID  . mirtazapine  30 mg Oral QHS  . multivitamin with minerals  1 tablet Oral Daily  . polyvinyl alcohol  1 drop Both Eyes BID  . rivastigmine  9.5 mg Transdermal Daily  . sodium chloride flush  3 mL Intravenous Q12H  . Vitamin D (Ergocalciferol)  50,000 Units Oral Weekly   Continuous Infusions: . sodium chloride    . sodium chloride       LOS: 6  days     Arnetha Courser, MD [facility] If 7PM-7AM, please contact night-coverage 04/14/2021, 2:41 PM

## 2021-04-14 NOTE — TOC Initial Note (Signed)
Transition of Care [facility]) - Initial/Assessment Note    Patient Details  Name: Justin Kim MRN: [id number] Date of Birth: 1946-08-10  Transition [facility] Care [facility]) CM/SW Contact:    Shelbie Hutching, RN Phone Number: 04/14/2021, 3:04 PM  Clinical Narrative:                 RNCM met with patient's sister at the bedside on 4/15.  Patient is from [facility] assisted living and would like to return there at discharge.  PT and OT are recommending SNF.  [facility] did come out to assess patient yesterday and they will accept patient back but not until he gets a couple of weeks of rehab.  Bed search started yesterday.  [facility] is only bed offer so far.  RNCM has reached out to [facility] to see if they can offer after the 10 day quarantine.  TOC will cont to follow and keep family updated.   Expected Discharge Plan: [place] Barriers to Discharge: SNF Covid   Patient Goals and CMS Choice Patient states their goals for this hospitalization and ongoing recovery are:: Patient wants to go back to [facility] ALF CMS Medicare.gov Compare Post Acute Care list provided to:: Patient Choice offered to / list presented to : Sibling,Patient  Expected Discharge Plan and Services Expected Discharge Plan: [place]   Discharge Planning Services: CM Consult Post Acute Care Choice: [facility] Living arrangements for the past 2 months: [place]                                      Prior Living Arrangements/Services Living arrangements for the past 2 months: [place] Lives with:: Facility Resident Patient language and need for interpreter reviewed:: Yes Do you feel safe going back to the place where you live?: Yes      Need for Family Participation in Patient Care: Yes (Comment) (weakness, COVID) Care giver support system in place?: Yes (comment) (sister) Current home services: DME (walker) Criminal Activity/Legal Involvement  Pertinent to Current Situation/Hospitalization: No - Comment as needed  Activities of Daily Living Home Assistive Devices/Equipment: Eyeglasses,Wheelchair,Walker (specify type) ADL Screening (condition at time of admission) Patient's cognitive ability adequate to safely complete daily activities?: No Is the patient deaf or have difficulty hearing?: No Does the patient have difficulty seeing, even when wearing glasses/contacts?: Yes Does the patient have difficulty concentrating, remembering, or making decisions?: No Patient able to express need for assistance with ADLs?: No Does the patient have difficulty dressing or bathing?: Yes Independently performs ADLs?: No Communication: Independent Dressing (OT): Needs assistance Is this a change from baseline?: Pre-admission baseline Grooming: Needs assistance Is this a change from baseline?: Pre-admission baseline Feeding: Independent Bathing: Needs assistance Is this a change from baseline?: Pre-admission baseline Toileting: Needs assistance Is this a change from baseline?: Pre-admission baseline In/Out Bed: Needs assistance Is this a change from baseline?: Pre-admission baseline Walks in Home: Needs assistance Is this a change from baseline?: Pre-admission baseline Does the patient have difficulty walking or climbing stairs?: Yes Weakness of Legs: Both Weakness of Arms/Hands: None  Permission Sought/Granted Permission sought to share information with : Case Manager,Family Chief Financial Officer Permission granted to share information with : Yes, Verbal Permission Granted     Permission granted to share info w AGENCY: [place] and SNFs  Permission granted to share info w Relationship: sister  Emotional Assessment       Orientation: : Oriented to Self Alcohol / Substance Use: Not Applicable Psych Involvement: No (comment)  Admission diagnosis:  Weakness [R53.1] Acute cystitis without hematuria  [N30.00] Generalized weakness [R53.1] Atrial fibrillation, unspecified type ([place]) [I48.91] COVID-19 virus detected [U07.1] Patient Active Problem List   Diagnosis Date Noted  . Generalized weakness 04/08/2021  . Acute lower UTI 04/08/2021  . Dementia due to Parkinson's disease without behavioral disturbance ([place]) 04/08/2021  . Parkinson disease ([place]) 04/08/2021  . Unspecified atrial fibrillation ([place]) 04/08/2021  . COVID-19 virus detected 04/08/2021   PCP:  Pcp, No Pharmacy:  No Pharmacies Listed    Social Determinants of Health (SDOH) Interventions    Readmission Risk Interventions No flowsheet data found.

## 2021-04-15 DIAGNOSIS — U071 COVID-19: Secondary | ICD-10-CM | POA: Diagnosis not present

## 2021-04-15 DIAGNOSIS — R531 Weakness: Secondary | ICD-10-CM | POA: Diagnosis not present

## 2021-04-15 DIAGNOSIS — F0280 Dementia in other diseases classified elsewhere without behavioral disturbance: Secondary | ICD-10-CM

## 2021-04-15 DIAGNOSIS — G20 Parkinson's disease: Secondary | ICD-10-CM

## 2021-04-15 MED ORDER — POLYETHYLENE GLYCOL 3350 17 G PO PACK
17.0000 g | PACK | Freq: Every day | ORAL | Status: DC
  Administered 2021-04-15 – 2021-04-18 (×4): 17 g via ORAL
  Filled 2021-04-15 (×4): qty 1

## 2021-04-15 MED ORDER — SENNOSIDES-DOCUSATE SODIUM 8.6-50 MG PO TABS
1.0000 | ORAL_TABLET | Freq: Every day | ORAL | Status: DC
  Administered 2021-04-15 – 2021-04-17 (×3): 1 via ORAL
  Filled 2021-04-15 (×3): qty 1

## 2021-04-15 NOTE — Progress Notes (Signed)
Occupational Therapy Treatment Patient Details Name: Justin Kim MRN: [id number] DOB: [birth year] Today's Date: 04/15/2021    History of present illness Pt admitted for generalized weakness with AMS x 2 days. History includes Parkinson's dx, HTN, and lewy body dementia.  Pt COVID+.   OT comments  Pt seen for OT treatment this date to f/u re: safety with ADLs/ADL mobility. OT engaes pt in unsupported bathing tasks with MOD A for UB and MAX A for LB and supported sitting for UB g/h taks including oral care wtih SETUP. Pt requires cues for sequencing throughout.  In addition, pt requires MOD A for bed mobility and MAX A from elevated surface for ADL transfers with RW including SPS from bed to chair. Pt left in chair with chair alarm and CNA/RN notified. Pt with all needs met and in reach. Pt progressing with ADL transfers and tolerance for participation in meaningful ADL tasks. Pt still with considerable weakness. Will continue to follow and continue to anticipate that pt will require STR f/u.    Follow Up Recommendations  SNF    Equipment Recommendations  Other (comment) (defer)    Recommendations for Other Services      Precautions / Restrictions Precautions Precautions: Fall Precaution Comments: COVID/Airborne and contact precautions Restrictions Weight Bearing Restrictions: No       Mobility Bed Mobility Overal bed mobility: Needs Assistance Bed Mobility: Supine to Sit     Supine to sit: Mod assist;HOB elevated     General bed mobility comments: MOD A for trunk/LE mgt, cues for hand placement to use bed rails, sequence of sup to sit transition    Transfers Overall transfer level: Needs assistance Equipment used: Rolling walker (2 wheeled) Transfers: Sit to/from [facility] Sit to Stand: Max assist;From elevated surface Stand pivot transfers: Max assist;From elevated surface       General transfer comment: increased time, surface elevated, tactile cues  to initiate    Balance Overall balance assessment: Needs assistance Sitting-balance support: Feet supported;Bilateral upper extremity supported Sitting balance-Leahy Scale: Poor Sitting balance - Comments: MIN/MOD A required for static sitting balance most of the time, but pt does have bouts of sustaining static sitting with UE support with only CGA/SBA     Standing balance-Leahy Scale: Zero Standing balance comment: able to CTS this date, but requires MAX A for lift off, MAX support noted through UEs on RW, and pt with hips slightly in flexion throughout.                           ADL either performed or assessed with clinical judgement   ADL Overall ADL's : Needs assistance/impaired Eating/Feeding: Set up;Minimal assistance;Cueing for sequencing;Sitting   Grooming: Oral care;Set up;Cueing for sequencing;Sitting Grooming Details (indicate cue type and reason): seated in recliner Upper Body Bathing: Moderate assistance;Sitting Upper Body Bathing Details (indicate cue type and reason): MOD A d/t poor static unsupported sitting balance EOB as well as decreased task initiation (likely 2/2 parkinson's) Lower Body Bathing: Maximal assistance;Sit to/from stand Lower Body Bathing Details (indicate cue type and reason): MAX A to CTS from elevated surface and MAX A to perform posterior standing LB bathing Upper Body Dressing : Minimal assistance;Sitting;Cueing for sequencing   Lower Body Dressing: Maximal assistance;Total assistance;Sitting/lateral leans Lower Body Dressing Details (indicate cue type and reason): to don socks                     Vision Patient Visual  Report: No change from baseline     Perception     Praxis      Cognition Arousal/Alertness: Awake/alert Behavior During Therapy: Flat affect Overall Cognitive Status: History of cognitive impairments - at baseline                                 General Comments: alert, requires  extended time for task initiation        Exercises Other Exercises Other Exercises: OT engaes pt in unsupported bathing tasks with MOD A for UB and MAX A for LB and supported sitting for UB g/h taks including oral care wtih SETUP.   Shoulder Instructions       General Comments      Pertinent Vitals/ Pain       Pain Assessment: No/denies pain  Home Living                                          Prior Functioning/Environment              Frequency  Min 1X/week        Progress Toward Goals  OT Goals(current goals can now be found in the care plan section)  Progress towards OT goals: Progressing toward goals  Acute Rehab OT Goals Patient Stated Goal: to get back to ALF OT Goal Formulation: With patient Time For Goal Achievement: 04/23/21 Potential to Achieve Goals: [place] Discharge plan remains appropriate;Frequency remains appropriate    Co-evaluation                 AM-PAC OT "6 Clicks" Daily Activity     Outcome Measure   Help from another person eating meals?: A Lot Help from another person taking care of personal grooming?: A Little Help from another person toileting, which includes using toliet, bedpan, or urinal?: A Lot Help from another person bathing (including washing, rinsing, drying)?: A Lot Help from another person to put on and taking off regular upper body clothing?: A Lot Help from another person to put on and taking off regular lower body clothing?: A Lot 6 Click Score: 13    End of Session Equipment Utilized During Treatment: Rolling walker  OT Visit Diagnosis: Unsteadiness on feet (R26.81);Muscle weakness (generalized) (M62.81);Feeding difficulties (R63.3);Other symptoms and signs involving cognitive function   Activity Tolerance Patient limited by fatigue   Patient Left with call bell/phone within reach;in chair;with chair alarm set   Nurse Communication Other (comment) (notified that he is up to chair  wtih chair alarm and has had bath/cltohing changed)        Time: 8003-4917 OT Time Calculation (min): 58 min  Charges: OT General Charges $OT Visit: 1 Visit OT Treatments $Self Care/Home Management : 38-52 mins $Therapeutic Activity: 23-37 mins  Gerrianne Scale, [place], OTR/L ascom [phone] 04/15/21, 6:24 PM

## 2021-04-15 NOTE — Progress Notes (Signed)
PROGRESS NOTE    Justin HaroldHarold Cordone   ZOX:[id number]  DOB: 01/14/1946  PCP: Pcp, No    DOA: 04/08/2021 LOS: 7   Brief Narrative   Justin Kim is a 75 y.o. male with medical history significant for Parkinson's disease with Lewy body dementia and hypertension who was brought to the ER by EMS for evaluation of weakness and disorientation for about 2 days.  Patient usually ambulates with a rolling walker has been too weak to do that.  No history of recent falls.  His brother-in-law was at the bedside states that he has difficulty with ambulation due to his Parkinson's disease.  PT recommending SNF- need to wait until 10 day COVID isolation period is complete before moving to SNF.  Anticipate d/c Monday 4/25.    Assessment & Plan   Principal Problem:   Generalized weakness Active Problems:   Acute lower UTI   Dementia due to Parkinson's disease without behavioral disturbance (HCC)   Parkinson disease (HCC)   Unspecified atrial fibrillation (HCC)   COVID-19 virus detected   Generalized weakness -multifactorial, secondary to recent UTI in addition to progression of Parkinson's disease.  PT and OT recommend SNF for rehab. -- Anticipate DC to SNF Monday 4/25 once off COVID isolation  UTI 2/2 Proteus mirabilis -present on admission with pyuria, patient reporting dysuria.  Patient reports symptoms resolved. Treated with Rocephin>> Keflex based on culture sensitivities. Completed antibiotics 4/19.  Parkinson's disease with dementia without behavioral disturbance -  --Continue Sinemet, Exelon, Remeron --PT OT recommend SNF  History of A. Fib -intermittently with RVR but does not sustain.  Not on anticoagulation due to high fall risk.  TSH was within normal. -- Continue Cardizem  COVID-19 infection -patient screened at time of admission and was positive for COVID-19.  He is vaccinated.  He reported productive cough.  Has not been hypoxic.  Monitoring clinically without COVID-specific  treatment and he is stable. -- Mucinex, Tessalon Perles    Patient BMI: Body mass index is 25.1 kg/m.   DVT prophylaxis: enoxaparin (LOVENOX) injection 40 mg Start: 04/08/21 1230   Diet:  Diet Orders (From admission, onward)    Start     Ordered   04/09/21 0950  Diet regular Room service appropriate? Yes with Assist; Fluid consistency: Thin  Diet effective now       Question Answer Comment  Room service appropriate? Yes with Assist   Fluid consistency: Thin      04/09/21 0950            Code Status: Full Code    Subjective 04/15/21    Patient up in chair when seen today.  Reports mild cough, occasionally productive.  Denies any dysuria, fevers or chills.  Also denies feeling short of breath.  No acute events reported.   Disposition Plan & Communication   Status is: Inpatient  Remains inpatient appropriate because:Requires SNF and unable to discharge until past 10-day COVID-19 isolation.  Anticipate DC Monday 4/25.   Dispo: The patient is from: Home              Anticipated d/c is to: SNF              Patient currently is medically stable to d/c.   Difficult to place patient No  Family Communication:     Consults, Procedures, Significant Events   Consultants:   none  Procedures:   none  Antimicrobials:  Anti-infectives (From admission, onward)   Start     Dose/Rate Route  Frequency Ordered Stop   04/12/21 1000  cephALEXin (KEFLEX) capsule 500 mg  Status:  Discontinued        500 mg Oral 2 times daily 04/11/21 1930 04/15/21 0801   04/09/21 1100  cefTRIAXone (ROCEPHIN) 1 g in sodium chloride 0.9 % 100 mL IVPB  Status:  Discontinued        1 g 200 mL/hr over 30 Minutes Intravenous Every 24 hours 04/08/21 1220 04/11/21 1930   04/08/21 1045  cefTRIAXone (ROCEPHIN) 1 g in sodium chloride 0.9 % 100 mL IVPB        1 g 200 mL/hr over 30 Minutes Intravenous  Once 04/08/21 1044 04/08/21 1148        Micro    Objective   Vitals:   04/14/21 2335 04/15/21  0410 04/15/21 0753 04/15/21 1232  BP: 117/84 120/83 (!) 145/94 125/83  Pulse: 71 60 74 78  Resp: 15 16 18 18   Temp: 97.6 F (36.4 C) 98.3 F (36.8 C) (!) 97.5 F (36.4 C) 97.9 F (36.6 C)  TempSrc: Axillary Oral Oral   SpO2: 97% 96% 98% 99%  Weight:      Height:        Intake/Output Summary (Last 24 hours) at 04/15/2021 1425 Last data filed at 04/15/2021 0914 Gross per 24 hour  Intake 121 ml  Output 900 ml  Net -779 ml   Filed Weights   04/08/21 0816  Weight: 81.6 kg    Physical Exam:  General exam: awake, alert, no acute distress HEENT: moist mucus membranes, hearing grossly normal  Respiratory system: CTAB, no wheezes, rales or rhonchi, normal respiratory effort. Cardiovascular system: normal S1/S2, irregular rhythm/regular rate, no pedal edema.   Gastrointestinal system: soft, NT, ND, no HSM felt, +bowel sounds. Central nervous system: no gross focal neurologic deficits, normal speech Extremities: moves all, no cyanosis, normal tone Skin: dry, intact, normal temperature, no rashes seen on visualized skin Psychiatry: normal mood, congruent affect  Labs   Data Reviewed: I have personally reviewed following labs and imaging studies  CBC: Recent Labs  Lab 04/09/21 0322 04/10/21 0552 04/11/21 0424 04/12/21 0320 04/13/21 0319  WBC 5.6 7.4 8.0 6.1 5.6  HGB 13.9 15.0 14.7 13.4 14.2  HCT 40.0 43.1 41.2 39.6 40.4  MCV 93.5 94.5 92.0 95.2 93.5  PLT 145* 148* 142* 142* 152   Basic Metabolic Panel: Recent Labs  Lab 04/09/21 0322 04/10/21 0552 04/11/21 0424 04/12/21 0320 04/13/21 0319  NA 140 140 140 140 141  K 4.0 3.8 3.5 3.7 3.6  CL 107 106 107 105 107  CO2 24 23 24 26 27   GLUCOSE 95 105* 129* 109* 102*  BUN 15 17 20  35* 29*  CREATININE 0.92 0.93 0.84 0.95 0.95  CALCIUM 8.7* 8.6* 8.4* 8.6* 8.5*  MG  --  2.1 2.1 2.3 2.1   GFR: Estimated Creatinine Clearance: 71.6 mL/min (by C-G formula based on SCr of 0.95 mg/dL). Liver Function Tests: No results for  input(s): AST, ALT, ALKPHOS, BILITOT, PROT, ALBUMIN in the last 168 hours. No results for input(s): LIPASE, AMYLASE in the last 168 hours. No results for input(s): AMMONIA in the last 168 hours. Coagulation Profile: No results for input(s): INR, PROTIME in the last 168 hours. Cardiac Enzymes: No results for input(s): CKTOTAL, CKMB, CKMBINDEX, TROPONINI in the last 168 hours. BNP (last 3 results) No results for input(s): PROBNP in the last 8760 hours. HbA1C: No results for input(s): HGBA1C in the last 72 hours. CBG: No results for input(s):  GLUCAP in the last 168 hours. Lipid Profile: No results for input(s): CHOL, HDL, LDLCALC, TRIG, CHOLHDL, LDLDIRECT in the last 72 hours. Thyroid Function Tests: No results for input(s): TSH, T4TOTAL, FREET4, T3FREE, THYROIDAB in the last 72 hours. Anemia Panel: No results for input(s): VITAMINB12, FOLATE, FERRITIN, TIBC, IRON, RETICCTPCT in the last 72 hours. Sepsis Labs: No results for input(s): PROCALCITON, LATICACIDVEN in the last 168 hours.  Recent Results (from the past 240 hour(s))  Resp Panel by RT-PCR (Flu A&B, Covid) Nasopharyngeal Swab     Status: Abnormal   Collection Time: 04/08/21  8:18 AM   Specimen: Nasopharyngeal Swab; Nasopharyngeal(NP) swabs in vial transport medium  Result Value Ref Range Status   SARS Coronavirus 2 by RT PCR POSITIVE (A) NEGATIVE Final    Comment: RESULT CALLED TO, READ BACK BY AND VERIFIED WITH:  BRIANNA CHAPMON AT 1012 04/08/21 SDR (NOTE) SARS-CoV-2 target nucleic acids are DETECTED.  The SARS-CoV-2 RNA is generally detectable in upper respiratory specimens during the acute phase of infection. Positive results are indicative of the presence of the identified virus, but do not rule out bacterial infection or co-infection with other pathogens not detected by the test. Clinical correlation with patient history and other diagnostic information is necessary to determine patient infection status. The expected  result is Negative.  Fact Sheet for Patients: BloggerCourse.com  Fact Sheet for Healthcare Providers: SeriousBroker.it  This test is not yet approved or cleared by the [place] FDA and  has been authorized for detection and/or diagnosis of SARS-CoV-2 by FDA under an Emergency Use Authorization (EUA).  This EUA will remain in effect (meaning this test can  be used) for the duration of  the COVID-19 declaration under Section 564(b)(1) of the Act, 21 U.S.C. section 360bbb-3(b)(1), unless the authorization is terminated or revoked sooner.     Influenza A by PCR NEGATIVE NEGATIVE Final   Influenza B by PCR NEGATIVE NEGATIVE Final    Comment: (NOTE) The Xpert Xpress SARS-CoV-2/FLU/RSV plus assay is intended as an aid in the diagnosis of influenza from Nasopharyngeal swab specimens and should not be used as a sole basis for treatment. Nasal washings and aspirates are unacceptable for Xpert Xpress SARS-CoV-2/FLU/RSV testing.  Fact Sheet for Patients: BloggerCourse.com  Fact Sheet for Healthcare Providers: SeriousBroker.it  This test is not yet approved or cleared by the [place] FDA and has been authorized for detection and/or diagnosis of SARS-CoV-2 by FDA under an Emergency Use Authorization (EUA). This EUA will remain in effect (meaning this test can be used) for the duration of the COVID-19 declaration under Section 564(b)(1) of the Act, 21 U.S.C. section 360bbb-3(b)(1), unless the authorization is terminated or revoked.  Performed at [facility], [address] [place], [place] [address]   Urine culture     Status: Abnormal   Collection Time: 04/08/21  8:18 AM   Specimen: Urine, Random  Result Value Ref Range Status   Specimen Description   Final    URINE, RANDOM Performed at [facility], [address], [place], [place] [address]     Special Requests   Final    NONE Performed at [facility], [address] [place], [place] [address]    Culture >=100,000 COLONIES/mL PROTEUS MIRABILIS (A)  Final   Report Status 04/10/2021 FINAL  Final   Organism ID, Bacteria PROTEUS MIRABILIS (A)  Final      Susceptibility   Proteus mirabilis - MIC*    AMPICILLIN <=2 SENSITIVE Sensitive  CEFAZOLIN <=4 SENSITIVE Sensitive     CEFEPIME <=0.12 SENSITIVE Sensitive     CEFTRIAXONE <=0.25 SENSITIVE Sensitive     CIPROFLOXACIN <=0.25 SENSITIVE Sensitive     GENTAMICIN <=1 SENSITIVE Sensitive     IMIPENEM 8 INTERMEDIATE Intermediate     NITROFURANTOIN 128 RESISTANT Resistant     TRIMETH/SULFA <=20 SENSITIVE Sensitive     AMPICILLIN/SULBACTAM <=2 SENSITIVE Sensitive     PIP/TAZO <=4 SENSITIVE Sensitive     * >=100,000 COLONIES/mL PROTEUS MIRABILIS  MRSA PCR Screening     Status: None   Collection Time: 04/08/21 12:43 PM   Specimen: Nasopharyngeal  Result Value Ref Range Status   MRSA by PCR NEGATIVE NEGATIVE Final    Comment:        The GeneXpert MRSA Assay (FDA approved for NASAL specimens only), is one component of a comprehensive MRSA colonization surveillance program. It is not intended to diagnose MRSA infection nor to guide or monitor treatment for MRSA infections. Performed at [facility], [address], [place], [place] [address]       Imaging Studies   No results found.   Medications   Scheduled Meds: . (feeding supplement) PROSource Plus  30 mL Oral BID BM  . carbidopa-levodopa  1 tablet Oral TID  . dextromethorphan-guaiFENesin  1 tablet Oral BID  . diltiazem  180 mg Oral Daily  . enoxaparin (LOVENOX) injection  40 mg Subcutaneous Q24H  . escitalopram  20 mg Oral Daily  . mirtazapine  30 mg Oral QHS  . multivitamin with minerals  1 tablet Oral Daily  . polyvinyl alcohol  1 drop Both Eyes BID  . rivastigmine  9.5 mg Transdermal Daily  . sodium chloride flush  3 mL Intravenous  Q12H  . Vitamin D (Ergocalciferol)  50,000 Units Oral Weekly   Continuous Infusions: . sodium chloride    . sodium chloride         LOS: 7 days    Time spent: 30 minutes    Pennie Banter, DO [facility]  04/15/2021, 2:25 PM      If 7PM-7AM, please contact night-coverage. How to contact the [facility] Attending or Consulting provider 7A - 7P or covering provider during after hours 7P -7A, for this patient?    1. Check the care team in [facility] and look for a) attending/consulting [facility] provider listed and b) the [facility] team listed 2. Log into [email or web address] and use [facility] universal password to access. If you do not have the password, please contact the hospital operator. 3. Locate the [facility] provider you are looking for under [facility] and page to a number that you can be directly reached. 4. If you still have difficulty reaching the provider, please page the [facility] (Director on Call) for the Hospitalists listed on amion for assistance.

## 2021-04-15 NOTE — Plan of Care (Signed)
Shift Summary: Pt oriented to self only, VSS, remains on RA. No c/o or s/s pain overnight. Took all meds crushed in applesauce, otherwise needing encouragement with PO intake. Adequate UO overnight, no BM this shift or for several days. Able to adjust self in bed for repositioning. Otherwise sleeping majority of shift but easily awoken. Fall/safety precautions in place, rounding performed, needs/concerns addressed.  Problem: Education: Goal: Knowledge of General Education information will improve Description: Including pain rating scale, medication(s)/side effects and non-pharmacologic comfort measures 04/15/2021 0623 by Michail Sermon, RN Outcome: Progressing 04/15/2021 0623 by Michail Sermon, RN Outcome: Progressing   Problem: Health Behavior/Discharge Planning: Goal: Ability to manage health-related needs will improve 04/15/2021 0623 by Michail Sermon, RN Outcome: Progressing 04/15/2021 0623 by Michail Sermon, RN Outcome: Progressing   Problem: Clinical Measurements: Goal: Ability to maintain clinical measurements within normal limits will improve 04/15/2021 0623 by Michail Sermon, RN Outcome: Progressing 04/15/2021 0623 by Michail Sermon, RN Outcome: Progressing Goal: Will remain free from infection 04/15/2021 0623 by Michail Sermon, RN Outcome: Progressing 04/15/2021 0623 by Michail Sermon, RN Outcome: Progressing Goal: Diagnostic test results will improve 04/15/2021 0623 by Michail Sermon, RN Outcome: Progressing 04/15/2021 0623 by Michail Sermon, RN Outcome: Progressing Goal: Respiratory complications will improve 04/15/2021 0623 by Michail Sermon, RN Outcome: Progressing 04/15/2021 0623 by Michail Sermon, RN Outcome: Progressing Goal: Cardiovascular complication will be avoided 04/15/2021 0623 by Michail Sermon, RN Outcome: Progressing 04/15/2021 0623 by Michail Sermon, RN Outcome: Progressing   Problem: Activity: Goal: Risk for activity intolerance will  decrease 04/15/2021 0623 by Michail Sermon, RN Outcome: Progressing 04/15/2021 0623 by Michail Sermon, RN Outcome: Progressing   Problem: Nutrition: Goal: Adequate nutrition will be maintained 04/15/2021 0623 by Michail Sermon, RN Outcome: Progressing 04/15/2021 0623 by Michail Sermon, RN Outcome: Progressing   Problem: Coping: Goal: Level of anxiety will decrease 04/15/2021 0623 by Michail Sermon, RN Outcome: Progressing 04/15/2021 0623 by Michail Sermon, RN Outcome: Progressing   Problem: Elimination: Goal: Will not experience complications related to bowel motility 04/15/2021 0623 by Michail Sermon, RN Outcome: Progressing 04/15/2021 0623 by Michail Sermon, RN Outcome: Progressing Goal: Will not experience complications related to urinary retention 04/15/2021 0623 by Michail Sermon, RN Outcome: Progressing 04/15/2021 0623 by Michail Sermon, RN Outcome: Progressing   Problem: Pain Managment: Goal: General experience of comfort will improve 04/15/2021 0623 by Michail Sermon, RN Outcome: Progressing 04/15/2021 0623 by Michail Sermon, RN Outcome: Progressing   Problem: Safety: Goal: Ability to remain free from injury will improve 04/15/2021 0623 by Michail Sermon, RN Outcome: Progressing 04/15/2021 0623 by Michail Sermon, RN Outcome: Progressing   Problem: Skin Integrity: Goal: Risk for impaired skin integrity will decrease 04/15/2021 0623 by Michail Sermon, RN Outcome: Progressing 04/15/2021 0623 by Michail Sermon, RN Outcome: Progressing   Problem: Education: Goal: Knowledge of General Education information will improve Description: Including pain rating scale, medication(s)/side effects and non-pharmacologic comfort measures 04/15/2021 0623 by Michail Sermon, RN Outcome: Progressing 04/15/2021 0623 by Michail Sermon, RN Outcome: Progressing   Problem: Health Behavior/Discharge Planning: Goal: Ability to manage health-related needs will  improve 04/15/2021 0623 by Michail Sermon, RN Outcome: Progressing 04/15/2021 0623 by Michail Sermon, RN Outcome: Progressing   Problem: Clinical Measurements: Goal: Ability to maintain clinical measurements within normal limits will improve 04/15/2021 0623 by Michail Sermon, RN Outcome: Progressing 04/15/2021 0623 by Michail Sermon, RN Outcome: Progressing Goal: Will remain free from infection 04/15/2021 0623 by Michail Sermon, RN Outcome: Progressing 04/15/2021 0623 by Michail Sermon, RN Outcome: Progressing Goal: Diagnostic test results will  improve 04/15/2021 0623 by Michail Sermon, RN Outcome: Progressing 04/15/2021 0623 by Michail Sermon, RN Outcome: Progressing Goal: Respiratory complications will improve 04/15/2021 0623 by Michail Sermon, RN Outcome: Progressing 04/15/2021 0623 by Michail Sermon, RN Outcome: Progressing Goal: Cardiovascular complication will be avoided 04/15/2021 0623 by Michail Sermon, RN Outcome: Progressing 04/15/2021 0623 by Michail Sermon, RN Outcome: Progressing   Problem: Activity: Goal: Risk for activity intolerance will decrease 04/15/2021 0623 by Michail Sermon, RN Outcome: Progressing 04/15/2021 0623 by Michail Sermon, RN Outcome: Progressing   Problem: Nutrition: Goal: Adequate nutrition will be maintained 04/15/2021 0623 by Michail Sermon, RN Outcome: Progressing 04/15/2021 0623 by Michail Sermon, RN Outcome: Progressing   Problem: Coping: Goal: Level of anxiety will decrease 04/15/2021 0623 by Michail Sermon, RN Outcome: Progressing 04/15/2021 0623 by Michail Sermon, RN Outcome: Progressing   Problem: Elimination: Goal: Will not experience complications related to bowel motility 04/15/2021 0623 by Michail Sermon, RN Outcome: Progressing 04/15/2021 0623 by Michail Sermon, RN Outcome: Progressing Goal: Will not experience complications related to urinary retention 04/15/2021 0623 by Michail Sermon, RN Outcome:  Progressing 04/15/2021 0623 by Michail Sermon, RN Outcome: Progressing   Problem: Pain Managment: Goal: General experience of comfort will improve 04/15/2021 0623 by Michail Sermon, RN Outcome: Progressing 04/15/2021 0623 by Michail Sermon, RN Outcome: Progressing   Problem: Safety: Goal: Ability to remain free from injury will improve 04/15/2021 0623 by Michail Sermon, RN Outcome: Progressing 04/15/2021 0623 by Michail Sermon, RN Outcome: Progressing   Problem: Skin Integrity: Goal: Risk for impaired skin integrity will decrease 04/15/2021 0623 by Michail Sermon, RN Outcome: Progressing 04/15/2021 0623 by Michail Sermon, RN Outcome: Progressing

## 2021-04-15 NOTE — Progress Notes (Signed)
Nutrition Follow-up  DOCUMENTATION CODES:  Not applicable  INTERVENTION:   MVI with minerals daily  30 ml Prosource Plus BID, each supplement provides 100 kcals and 15 grams protein  Magic cup TID with meals, each supplement provides 290 kcal and 9 grams of protein  NUTRITION DIAGNOSIS:  Increased nutrient needs related to acute illness (COVID-19) as evidenced by estimated needs. -progressing  GOAL:  Patient will meet greater than or equal to 90% of their needs - supplements in place, some intake of meals  MONITOR:  PO intake,Supplement acceptance,Labs,Weight trends,I & O's,Skin  REASON FOR ASSESSMENT:  Malnutrition Screening Tool    ASSESSMENT:  Pt admitted with generalized weakness and UTI after being brought to the ER for weakness and disorientation for about 2 days. Patient usually ambulates with a rolling walker has been too weak to do that. Family states that he has difficulty with ambulation due to his Parkinson's disease. PMH relevant for Parkinson's disease with Lewy body dementia, hypertension, atrial fibrillation  Pt resting in bedside chair at the time of visit. Lunch tray had just arrived. Pt minimally conversant, would only give 1-2 word answers. Voice very quiet and difficult to hear. States his appetite is fine and he is eating ok. Did not seem interested in discussing nutrition at this time. Assisted pt with setting up tray, pt reports he does not need help opening containers or with feeding. Declined physical exam at this time. Will attempt more complete interview and exam at next assessment.   Average Intake: 0% intake x 1 recorded meal this admission  Relevant Scheduled Meds: . mirtazapine  30 mg Oral QHS  . multivitamin with minerals  1 tablet Oral Daily  . Vitamin D (Ergocalciferol)  50,000 Units Oral Weekly   Relevant PRN Meds: ondansetron   Labs reviewed  Diet Order:   Diet Order            Diet regular Room service appropriate? Yes with Assist;  Fluid consistency: Thin  Diet effective now                 EDUCATION NEEDS:  No education needs have been identified at this time  Skin:  Skin Assessment: Reviewed RN Assessment  Last BM:  4/18 per RN documentation  Height:  Ht Readings from Last 1 Encounters:  04/08/21 5\' 11"  (1.803 m)    Weight:  Wt Readings from Last 1 Encounters:  04/08/21 81.6 kg    Ideal Body Weight:  78.2 kg  BMI:  Body mass index is 25.1 kg/m.  Estimated Nutritional Needs:   Kcal:  2050-2250  Protein:  105-120 grams  Fluid:  > 2 L  07-29-1979, RD, LDN Clinical Dietitian Pager on Amion

## 2021-04-15 NOTE — Hospital Course (Addendum)
Justin Kim is a 75 y.o. male with medical history significant for Parkinson's disease with Lewy body dementia and hypertension who was brought to the ER by EMS for evaluation of weakness and disorientation for about 2 days.  Patient usually ambulates with a rolling walker has been too weak to do that.  No history of recent falls.  His brother-in-law was at the bedside states that he has difficulty with ambulation due to his Parkinson's disease.  Found to have a UTI and incidentally positive for Covid-19.  PT recommending SNF.  Patient's discharge to SNF was delayed due to requirement for 10 days isolation for Covid-19 infection.  Pt too profoundly weak to return to ALF at this time.

## 2021-04-16 DIAGNOSIS — F0280 Dementia in other diseases classified elsewhere without behavioral disturbance: Secondary | ICD-10-CM | POA: Diagnosis not present

## 2021-04-16 DIAGNOSIS — N390 Urinary tract infection, site not specified: Principal | ICD-10-CM

## 2021-04-16 DIAGNOSIS — R531 Weakness: Secondary | ICD-10-CM | POA: Diagnosis not present

## 2021-04-16 DIAGNOSIS — G20 Parkinson's disease: Secondary | ICD-10-CM | POA: Diagnosis not present

## 2021-04-16 NOTE — Plan of Care (Signed)
Shift Summary: No acute events overnight. Oriented to self, very soft spoken. Remains on RA, VSS. No endorsement of pain. Fall/safety precautions in place, rounding performed, needs/concerns addressed during shift.   Problem: Education: Goal: Knowledge of General Education information will improve Description: Including pain rating scale, medication(s)/side effects and non-pharmacologic comfort measures Outcome: Progressing   Problem: Health Behavior/Discharge Planning: Goal: Ability to manage health-related needs will improve Outcome: Progressing   Problem: Clinical Measurements: Goal: Ability to maintain clinical measurements within normal limits will improve Outcome: Progressing Goal: Will remain free from infection Outcome: Progressing Goal: Diagnostic test results will improve Outcome: Progressing Goal: Respiratory complications will improve Outcome: Progressing Goal: Cardiovascular complication will be avoided Outcome: Progressing   Problem: Activity: Goal: Risk for activity intolerance will decrease Outcome: Progressing   Problem: Nutrition: Goal: Adequate nutrition will be maintained Outcome: Progressing   Problem: Coping: Goal: Level of anxiety will decrease Outcome: Progressing   Problem: Elimination: Goal: Will not experience complications related to bowel motility Outcome: Progressing Goal: Will not experience complications related to urinary retention Outcome: Progressing   Problem: Pain Managment: Goal: General experience of comfort will improve Outcome: Progressing   Problem: Safety: Goal: Ability to remain free from injury will improve Outcome: Progressing   Problem: Skin Integrity: Goal: Risk for impaired skin integrity will decrease Outcome: Progressing   Problem: Education: Goal: Knowledge of General Education information will improve Description: Including pain rating scale, medication(s)/side effects and non-pharmacologic comfort  measures Outcome: Progressing   Problem: Health Behavior/Discharge Planning: Goal: Ability to manage health-related needs will improve Outcome: Progressing   Problem: Clinical Measurements: Goal: Ability to maintain clinical measurements within normal limits will improve Outcome: Progressing   Problem: Clinical Measurements: Goal: Ability to maintain clinical measurements within normal limits will improve Outcome: Progressing Goal: Will remain free from infection Outcome: Progressing Goal: Diagnostic test results will improve Outcome: Progressing Goal: Respiratory complications will improve Outcome: Progressing Goal: Cardiovascular complication will be avoided Outcome: Progressing   Problem: Activity: Goal: Risk for activity intolerance will decrease Outcome: Progressing   Problem: Nutrition: Goal: Adequate nutrition will be maintained Outcome: Progressing   Problem: Coping: Goal: Level of anxiety will decrease Outcome: Progressing   Problem: Elimination: Goal: Will not experience complications related to bowel motility Outcome: Progressing Goal: Will not experience complications related to urinary retention Outcome: Progressing   Problem: Pain Managment: Goal: General experience of comfort will improve Outcome: Progressing   Problem: Safety: Goal: Ability to remain free from injury will improve Outcome: Progressing   Problem: Skin Integrity: Goal: Risk for impaired skin integrity will decrease Outcome: Progressing

## 2021-04-16 NOTE — Progress Notes (Signed)
Physical Therapy Treatment Patient Details Name: Justin Kim MRN: [id number] DOB: March 11, 1946 Today's Date: 04/16/2021    History of Present Illness Pt admitted for generalized weakness with AMS x 2 days. History includes Parkinson's dx, HTN, and lewy body dementia.  Pt COVID+.    PT Comments    Patient agreeable to PT treatment session. Patient required Max A for bed mobility with increased time for motor planning throughout session. Patient able to stand x 3 bouts with rest break between bouts of standing due to fatigue. Max A is required with facilitation for midline posture and forward weight shifting. Verbal cues for technique with mobility efforts. Patient is not at his baseline as he is usually ambulatory with rolling walker and does not typically require physical assistance for bed mobility/transfers at ALF. Recommend SNF for ongoing PT efforts to improve independence prior to going back to [facility] ALF. PT will continue to follow.     Follow Up Recommendations  SNF     Equipment Recommendations  None recommended by PT    Recommendations for Other Services       Precautions / Restrictions Precautions Precautions: Fall Restrictions Weight Bearing Restrictions: No    Mobility  Bed Mobility Overal bed mobility: Needs Assistance Bed Mobility: Supine to Sit;Rolling Rolling: Mod assist (for rolling to left x 2 bouts for bed pan placement)   Supine to sit: Max assist Sit to supine: Max assist   General bed mobility comments: assistance for trunk and BLE support. verbal cues for technique. increased time required to complete tasks    Transfers Overall transfer level: Needs assistance Equipment used: Rolling walker (2 wheeled) Transfers: Sit to/from Stand Sit to Stand: Max assist         General transfer comment: patient has strong posterior lean with standing efforts. Patient needs faciliation for foward flexion and hip extension to facilitate standing. 3 bouts  of standing performed with the bed in lowest height and maximal verbal cues for sequencing and technique  Ambulation/Gait             General Gait Details: unable to at this time due to poor standing tolerance   Stairs             Wheelchair Mobility    Modified Rankin (Stroke Patients Only)       Balance Overall balance assessment: Needs assistance Sitting-balance support: Feet supported;No upper extremity supported Sitting balance-Leahy Scale: Fair Sitting balance - Comments: mild right lean noted initially. patient required Min A progressing to stand by assistance with increased sitting time   Standing balance support: Bilateral upper extremity supported Standing balance-Leahy Scale: Zero Standing balance comment: strong posterior lean in standing. faciliation for forward weight shifting and cues for hip and knee extension                            Cognition Arousal/Alertness: Awake/alert Behavior During Therapy: WFL for tasks assessed/performed;Flat affect                                   General Comments: Patient is known to this therapist from [facility]. Patient ambulates with a rolling walker with supervision/Mod I at baseline. Patient is slow overall at baseline to respond and has low tone voice. Patient is usually oriented x 4 and able to follow all commands. Patient needs increased time for motor planning and  processing this session and is able to follow commands with increased time.      Exercises      General Comments        Pertinent Vitals/Pain Pain Assessment: No/denies pain    Home Living                      Prior Function            PT Goals (current goals can now be found in the care plan section) Acute Rehab PT Goals Patient Stated Goal: to get back to ALF PT Goal Formulation: With patient Time For Goal Achievement: 04/22/21 Potential to Achieve Goals: Fair Progress towards PT goals:  Progressing toward goals    Frequency    Min 2X/week      PT Plan Current plan remains appropriate    Co-evaluation              AM-PAC PT "6 Clicks" Mobility   Outcome Measure  Help needed turning from your back to your side while in a flat bed without using bedrails?: A Lot Help needed moving from lying on your back to sitting on the side of a flat bed without using bedrails?: A Lot Help needed moving to and from a bed to a chair (including a wheelchair)?: Total Help needed standing up from a chair using your arms (e.g., wheelchair or bedside chair)?: Total Help needed to walk in hospital room?: Total Help needed climbing 3-5 steps with a railing? : Total 6 Click Score: 8    End of Session   Activity Tolerance: Patient tolerated treatment well Patient left: in bed;with call bell/phone within reach;with bed alarm set (set-up with lunch tray) Nurse Communication: Mobility status PT Visit Diagnosis: Muscle weakness (generalized) (M62.81);Difficulty in walking, not elsewhere classified (R26.2)     Time: 4196-2229 PT Time Calculation (min) (ACUTE ONLY): 47 min  Charges:  $Therapeutic Activity: 38-52 mins                     Donna Bernard, PT, MPT    Justin Kim 04/16/2021, 1:51 PM

## 2021-04-16 NOTE — Progress Notes (Signed)
PROGRESS NOTE    Justin HaroldHarold Silvio   WJX:[id number]  DOB: 08/07/1946  PCP: Pcp, No    DOA: 04/08/2021 LOS: 8   Brief Narrative   Justin Kim is a 75 y.o. male with medical history significant for Parkinson's disease with Lewy body dementia and hypertension who was brought to the ER by EMS for evaluation of weakness and disorientation for about 2 days.  Patient usually ambulates with a rolling walker has been too weak to do that.  No history of recent falls.  His brother-in-law was at the bedside states that he has difficulty with ambulation due to his Parkinson's disease.  PT recommending SNF- need to wait until 10 day COVID isolation period is complete before moving to SNF.  Anticipate d/c Monday 4/25.    Assessment & Plan   Principal Problem:   Generalized weakness Active Problems:   Acute lower UTI   Dementia due to Parkinson's disease without behavioral disturbance (HCC)   Parkinson disease (HCC)   Unspecified atrial fibrillation (HCC)   COVID-19 virus detected   Generalized weakness -multifactorial, secondary to recent UTI in addition to progression of Parkinson's disease.  PT and OT recommend SNF for rehab. -- Anticipate DC to SNF Monday 4/25 once off COVID isolation  UTI 2/2 Proteus mirabilis -present on admission with pyuria, patient reporting dysuria.  Patient reports symptoms resolved. Treated with Rocephin>> Keflex based on culture sensitivities. Completed antibiotics 4/19.  Parkinson's disease with dementia without behavioral disturbance -  --Continue Sinemet, Exelon, Remeron --PT OT recommend SNF  History of A. Fib -intermittently with RVR but does not sustain.  Not on anticoagulation due to high fall risk.  TSH was within normal. -- Continue Cardizem  COVID-19 infection -patient screened at time of admission and was positive for COVID-19.  He is vaccinated.  He reported productive cough.  Has not been hypoxic.  Monitoring clinically without COVID-specific  treatment and he is stable. -- Mucinex, Tessalon Perles    Patient BMI: Body mass index is 23.77 kg/m.   DVT prophylaxis: enoxaparin (LOVENOX) injection 40 mg Start: 04/08/21 1230   Diet:  Diet Orders (From admission, onward)    Start     Ordered   04/09/21 0950  Diet regular Room service appropriate? Yes with Assist; Fluid consistency: Thin  Diet effective now       Question Answer Comment  Room service appropriate? Yes with Assist   Fluid consistency: Thin      04/09/21 0950            Code Status: Full Code    Subjective 04/16/21    Patient awake laying in bed when seen today.  Unsure when he last had a BM so has not had 1 today.  Denies any pain, shortness of breath, fevers chills or other acute complaints.   Disposition Plan & Communication   Status is: Inpatient  Remains inpatient appropriate because:Requires SNF and unable to discharge until past 10-day COVID-19 isolation.  Anticipate DC Monday 4/25.   Dispo: The patient is from: Home              Anticipated d/c is to: SNF              Patient currently is medically stable to d/c.   Difficult to place patient No  Family Communication:     Consults, Procedures, Significant Events   Consultants:   none  Procedures:   none  Antimicrobials:  Anti-infectives (From admission, onward)   Start  Dose/Rate Route Frequency Ordered Stop   04/12/21 1000  cephALEXin (KEFLEX) capsule 500 mg  Status:  Discontinued        500 mg Oral 2 times daily 04/11/21 1930 04/15/21 0801   04/09/21 1100  cefTRIAXone (ROCEPHIN) 1 g in sodium chloride 0.9 % 100 mL IVPB  Status:  Discontinued        1 g 200 mL/hr over 30 Minutes Intravenous Every 24 hours 04/08/21 1220 04/11/21 1930   04/08/21 1045  cefTRIAXone (ROCEPHIN) 1 g in sodium chloride 0.9 % 100 mL IVPB        1 g 200 mL/hr over 30 Minutes Intravenous  Once 04/08/21 1044 04/08/21 1148        Micro    Objective   Vitals:   04/15/21 2137 04/15/21 2351  04/16/21 0519 04/16/21 1002  BP: 125/75 116/80 (!) 149/85 134/66  Pulse: 66 68 66 67  Resp: 14 16 16 16   Temp: 98.3 F (36.8 C) 98.4 F (36.9 C) 97.7 F (36.5 C) 98.4 F (36.9 C)  TempSrc: Oral Oral Oral   SpO2: 96% 95% 97% 97%  Weight:      Height:        Intake/Output Summary (Last 24 hours) at 04/16/2021 1740 Last data filed at 04/16/2021 0523 Gross per 24 hour  Intake 150 ml  Output 1100 ml  Net -950 ml   Filed Weights   04/08/21 0816 04/15/21 1500  Weight: 81.6 kg 77.3 kg    Physical Exam:  General exam: awake, alert, no acute distress Respiratory system: CTA B, no wheezes or rhonchi, normal respiratory effort, on room air. Cardiovascular system: normal S1/S2, irregular rhythm, regular rate, no pedal edema.   Gastrointestinal system: Soft, nontender, positive bowel sounds Central nervous system: CNs 2 through 12 grossly intact, normal speech Extremities: Moves all, no lower extremity edema, no cyanosis   Labs   Data Reviewed: I have personally reviewed following labs and imaging studies  CBC: Recent Labs  Lab 04/10/21 0552 04/11/21 0424 04/12/21 0320 04/13/21 0319  WBC 7.4 8.0 6.1 5.6  HGB 15.0 14.7 13.4 14.2  HCT 43.1 41.2 39.6 40.4  MCV 94.5 92.0 95.2 93.5  PLT 148* 142* 142* 152   Basic Metabolic Panel: Recent Labs  Lab 04/10/21 0552 04/11/21 0424 04/12/21 0320 04/13/21 0319  NA 140 140 140 141  K 3.8 3.5 3.7 3.6  CL 106 107 105 107  CO2 23 24 26 27   GLUCOSE 105* 129* 109* 102*  BUN 17 20 35* 29*  CREATININE 0.93 0.84 0.95 0.95  CALCIUM 8.6* 8.4* 8.6* 8.5*  MG 2.1 2.1 2.3 2.1   GFR: Estimated Creatinine Clearance: 71.6 mL/min (by C-G formula based on SCr of 0.95 mg/dL). Liver Function Tests: No results for input(s): AST, ALT, ALKPHOS, BILITOT, PROT, ALBUMIN in the last 168 hours. No results for input(s): LIPASE, AMYLASE in the last 168 hours. No results for input(s): AMMONIA in the last 168 hours. Coagulation Profile: No results for  input(s): INR, PROTIME in the last 168 hours. Cardiac Enzymes: No results for input(s): CKTOTAL, CKMB, CKMBINDEX, TROPONINI in the last 168 hours. BNP (last 3 results) No results for input(s): PROBNP in the last 8760 hours. HbA1C: No results for input(s): HGBA1C in the last 72 hours. CBG: No results for input(s): GLUCAP in the last 168 hours. Lipid Profile: No results for input(s): CHOL, HDL, LDLCALC, TRIG, CHOLHDL, LDLDIRECT in the last 72 hours. Thyroid Function Tests: No results for input(s): TSH, T4TOTAL, FREET4, T3FREE,  THYROIDAB in the last 72 hours. Anemia Panel: No results for input(s): VITAMINB12, FOLATE, FERRITIN, TIBC, IRON, RETICCTPCT in the last 72 hours. Sepsis Labs: No results for input(s): PROCALCITON, LATICACIDVEN in the last 168 hours.  Recent Results (from the past 240 hour(s))  Resp Panel by RT-PCR (Flu A&B, Covid) Nasopharyngeal Swab     Status: Abnormal   Collection Time: 04/08/21  8:18 AM   Specimen: Nasopharyngeal Swab; Nasopharyngeal(NP) swabs in vial transport medium  Result Value Ref Range Status   SARS Coronavirus 2 by RT PCR POSITIVE (A) NEGATIVE Final    Comment: RESULT CALLED TO, READ BACK BY AND VERIFIED WITH:  BRIANNA CHAPMON AT 1012 04/08/21 SDR (NOTE) SARS-CoV-2 target nucleic acids are DETECTED.  The SARS-CoV-2 RNA is generally detectable in upper respiratory specimens during the acute phase of infection. Positive results are indicative of the presence of the identified virus, but do not rule out bacterial infection or co-infection with other pathogens not detected by the test. Clinical correlation with patient history and other diagnostic information is necessary to determine patient infection status. The expected result is Negative.  Fact Sheet for Patients: BloggerCourse.com  Fact Sheet for Healthcare Providers: SeriousBroker.it  This test is not yet approved or cleared by the [place] FDA and  has been authorized for detection and/or diagnosis of SARS-CoV-2 by FDA under an Emergency Use Authorization (EUA).  This EUA will remain in effect (meaning this test can  be used) for the duration of  the COVID-19 declaration under Section 564(b)(1) of the Act, 21 U.S.C. section 360bbb-3(b)(1), unless the authorization is terminated or revoked sooner.     Influenza A by PCR NEGATIVE NEGATIVE Final   Influenza B by PCR NEGATIVE NEGATIVE Final    Comment: (NOTE) The Xpert Xpress SARS-CoV-2/FLU/RSV plus assay is intended as an aid in the diagnosis of influenza from Nasopharyngeal swab specimens and should not be used as a sole basis for treatment. Nasal washings and aspirates are unacceptable for Xpert Xpress SARS-CoV-2/FLU/RSV testing.  Fact Sheet for Patients: BloggerCourse.com  Fact Sheet for Healthcare Providers: SeriousBroker.it  This test is not yet approved or cleared by the [place] FDA and has been authorized for detection and/or diagnosis of SARS-CoV-2 by FDA under an Emergency Use Authorization (EUA). This EUA will remain in effect (meaning this test can be used) for the duration of the COVID-19 declaration under Section 564(b)(1) of the Act, 21 U.S.C. section 360bbb-3(b)(1), unless the authorization is terminated or revoked.  Performed at [facility], [address], [place], [place] [address]   Urine culture     Status: Abnormal   Collection Time: 04/08/21  8:18 AM   Specimen: Urine, Random  Result Value Ref Range Status   Specimen Description   Final    URINE, RANDOM Performed at [facility], [address], [place], [place] [address]    Special Requests   Final    NONE Performed at [facility], [address] [place], [place] [address]    Culture >=100,000 COLONIES/mL PROTEUS MIRABILIS (A)  Final   Report Status 04/10/2021 FINAL  Final   Organism ID,  Bacteria PROTEUS MIRABILIS (A)  Final      Susceptibility   Proteus mirabilis - MIC*    AMPICILLIN <=2 SENSITIVE Sensitive     CEFAZOLIN <=4 SENSITIVE Sensitive     CEFEPIME <=0.12 SENSITIVE Sensitive     CEFTRIAXONE <=0.25 SENSITIVE Sensitive     CIPROFLOXACIN <=0.25 SENSITIVE Sensitive  GENTAMICIN <=1 SENSITIVE Sensitive     IMIPENEM 8 INTERMEDIATE Intermediate     NITROFURANTOIN 128 RESISTANT Resistant     TRIMETH/SULFA <=20 SENSITIVE Sensitive     AMPICILLIN/SULBACTAM <=2 SENSITIVE Sensitive     PIP/TAZO <=4 SENSITIVE Sensitive     * >=100,000 COLONIES/mL PROTEUS MIRABILIS  MRSA PCR Screening     Status: None   Collection Time: 04/08/21 12:43 PM   Specimen: Nasopharyngeal  Result Value Ref Range Status   MRSA by PCR NEGATIVE NEGATIVE Final    Comment:        The GeneXpert MRSA Assay (FDA approved for NASAL specimens only), is one component of a comprehensive MRSA colonization surveillance program. It is not intended to diagnose MRSA infection nor to guide or monitor treatment for MRSA infections. Performed at [facility], [address] [place], [place] [address]       Imaging Studies   No results found.   Medications   Scheduled Meds: . (feeding supplement) PROSource Plus  30 mL Oral BID BM  . carbidopa-levodopa  1 tablet Oral TID  . dextromethorphan-guaiFENesin  1 tablet Oral BID  . diltiazem  180 mg Oral Daily  . enoxaparin (LOVENOX) injection  40 mg Subcutaneous Q24H  . escitalopram  20 mg Oral Daily  . mirtazapine  30 mg Oral QHS  . multivitamin with minerals  1 tablet Oral Daily  . polyethylene glycol  17 g Oral Daily  . polyvinyl alcohol  1 drop Both Eyes BID  . rivastigmine  9.5 mg Transdermal Daily  . senna-docusate  1 tablet Oral QHS  . sodium chloride flush  3 mL Intravenous Q12H  . Vitamin D (Ergocalciferol)  50,000 Units Oral Weekly   Continuous Infusions: . sodium chloride    . sodium chloride         LOS: 8 days     Time spent: 20 minutes    Pennie Banter, DO [facility]  04/16/2021, 5:40 PM      If 7PM-7AM, please contact night-coverage. How to contact the [facility] Attending or Consulting provider 7A - 7P or covering provider during after hours 7P -7A, for this patient?    1. Check the care team in [facility] and look for a) attending/consulting TRH provider listed and b) the [facility] team listed 2. Log into [email or web address] and use [place]'s universal password to access. If you do not have the password, please contact the hospital operator. 3. Locate the [facility] provider you are looking for under [facility] and page to a number that you can be directly reached. 4. If you still have difficulty reaching the provider, please page the [facility] (Director on Call) for the Hospitalists listed on amion for assistance.

## 2021-04-17 DIAGNOSIS — R531 Weakness: Secondary | ICD-10-CM | POA: Diagnosis not present

## 2021-04-17 NOTE — TOC Progression Note (Signed)
Transition of Care [facility]) - Progression Note    Patient Details  Name: Justin Kim MRN: [id number] Date of Birth: 02/27/1946  [facility] CM/SW Contact  Allayne Butcher, RN Phone Number: 04/17/2021, 5:01 PM  Clinical Narrative:    Passr pending.  Plan on DC to [facility] on Monday.    Expected Discharge Plan: Skilled Nursing Facility Barriers to Discharge: SNF Covid  Expected Discharge Plan and Services Expected Discharge Plan: Skilled Nursing Facility   Discharge Planning Services: CM Consult Post Acute Care Choice: Skilled Nursing Facility Living arrangements for the past 2 months: Assisted Living Facility                                       Social Determinants of Health (SDOH) Interventions    Readmission Risk Interventions No flowsheet data found.

## 2021-04-17 NOTE — Progress Notes (Signed)
PROGRESS NOTE    Bradshaw Minihan   RSW:[id number]  DOB: Jul 26, 1946  PCP: Pcp, No    DOA: 04/08/2021 LOS: 9   Brief Narrative   Justin Kim is a 75 y.o. male with medical history significant for Parkinson's disease with Lewy body dementia and hypertension who was brought to the ER by EMS for evaluation of weakness and disorientation for about 2 days.  Patient usually ambulates with a rolling walker has been too weak to do that.  No history of recent falls.  His brother-in-law was at the bedside states that he has difficulty with ambulation due to his Parkinson's disease.  PT recommending SNF- need to wait until 10 day COVID isolation period is complete before moving to SNF.  Anticipate d/c Monday 4/25.    Assessment & Plan   Principal Problem:   Generalized weakness Active Problems:   Acute lower UTI   Dementia due to Parkinson's disease without behavioral disturbance (HCC)   Parkinson disease (HCC)   Unspecified atrial fibrillation (HCC)   COVID-19 virus detected   Generalized weakness -multifactorial, secondary to recent UTI in addition to progression of Parkinson's disease.  PT and OT recommend SNF for rehab. -- Anticipate DC to SNF Monday 4/25 once off COVID isolation  UTI 2/2 Proteus mirabilis -present on admission with pyuria, patient reporting dysuria.  Patient reports symptoms resolved. Treated with Rocephin>> Keflex based on culture sensitivities. Completed antibiotics 4/19.  Parkinson's disease with dementia without behavioral disturbance -  --Continue Sinemet, Exelon, Remeron --PT OT recommend SNF  History of A. Fib -intermittently with RVR but does not sustain.  Not on anticoagulation due to high fall risk.  TSH was within normal. -- Continue Cardizem  COVID-19 infection -patient screened at time of admission and was positive for COVID-19.  He is vaccinated.  He reported productive cough.  Has not been hypoxic.  Monitoring clinically without COVID-specific  treatment and he is stable. -- Mucinex, Tessalon Perles    Patient BMI: Body mass index is 23.77 kg/m.   DVT prophylaxis: enoxaparin (LOVENOX) injection 40 mg Start: 04/08/21 1230   Diet:  Diet Orders (From admission, onward)    Start     Ordered   04/09/21 0950  Diet regular Room service appropriate? Yes with Assist; Fluid consistency: Thin  Diet effective now       Question Answer Comment  Room service appropriate? Yes with Assist   Fluid consistency: Thin      04/09/21 0950            Code Status: Full Code    Subjective 04/17/21    Patient awake laying in bed when seen today. Sister had just arrived to visit.  He reports feeling well and really wants to go home.  Tired on being in hospital.  Denies any acute complaints.    Disposition Plan & Communication   Status is: Inpatient  Remains inpatient appropriate because:Requires SNF and unable to discharge until past 10-day COVID-19 isolation.  Anticipate DC Monday 4/25.   Dispo: The patient is from: Home              Anticipated d/c is to: SNF              Patient currently is medically stable to d/c.   Difficult to place patient No  Family Communication:  Sister at bedside on rounds today   Consults, Procedures, Significant Events   Consultants:   none  Procedures:   none  Antimicrobials:  Anti-infectives (From admission,  onward)   Start     Dose/Rate Route Frequency Ordered Stop   04/12/21 1000  cephALEXin (KEFLEX) capsule 500 mg  Status:  Discontinued        500 mg Oral 2 times daily 04/11/21 1930 04/15/21 0801   04/09/21 1100  cefTRIAXone (ROCEPHIN) 1 g in sodium chloride 0.9 % 100 mL IVPB  Status:  Discontinued        1 g 200 mL/hr over 30 Minutes Intravenous Every 24 hours 04/08/21 1220 04/11/21 1930   04/08/21 1045  cefTRIAXone (ROCEPHIN) 1 g in sodium chloride 0.9 % 100 mL IVPB        1 g 200 mL/hr over 30 Minutes Intravenous  Once 04/08/21 1044 04/08/21 1148        Micro     Objective   Vitals:   04/16/21 2335 04/17/21 0601 04/17/21 1037 04/17/21 1411  BP: 132/79 136/88 129/81 109/75  Pulse: 73 70 64 76  Resp: 14 16 17    Temp: 98 F (36.7 C) 98.3 F (36.8 C) 98.9 F (37.2 C) (!) 97.5 F (36.4 C)  TempSrc: Oral Oral  Oral  SpO2: 96% 98% 97% 97%  Weight:      Height:        Intake/Output Summary (Last 24 hours) at 04/17/2021 1741 Last data filed at 04/17/2021 0557 Gross per 24 hour  Intake 100 ml  Output 600 ml  Net -500 ml   Filed Weights   04/08/21 0816 04/15/21 1500  Weight: 81.6 kg 77.3 kg    Physical Exam:  General exam: awake, alert, no acute distress Respiratory system: normal respiratory effort, on room air. Cardiovascular system: regular rate, no pedal edema.   Central nervous system: normal speech, grossly nonfocal exam Extremities: no lower extremity edema, no cyanosis   Labs   Data Reviewed: I have personally reviewed following labs and imaging studies  CBC: Recent Labs  Lab 04/11/21 0424 04/12/21 0320 04/13/21 0319  WBC 8.0 6.1 5.6  HGB 14.7 13.4 14.2  HCT 41.2 39.6 40.4  MCV 92.0 95.2 93.5  PLT 142* 142* 152   Basic Metabolic Panel: Recent Labs  Lab 04/11/21 0424 04/12/21 0320 04/13/21 0319  NA 140 140 141  K 3.5 3.7 3.6  CL 107 105 107  CO2 24 26 27   GLUCOSE 129* 109* 102*  BUN 20 35* 29*  CREATININE 0.84 0.95 0.95  CALCIUM 8.4* 8.6* 8.5*  MG 2.1 2.3 2.1   GFR: Estimated Creatinine Clearance: 71.6 mL/min (by C-G formula based on SCr of 0.95 mg/dL). Liver Function Tests: No results for input(s): AST, ALT, ALKPHOS, BILITOT, PROT, ALBUMIN in the last 168 hours. No results for input(s): LIPASE, AMYLASE in the last 168 hours. No results for input(s): AMMONIA in the last 168 hours. Coagulation Profile: No results for input(s): INR, PROTIME in the last 168 hours. Cardiac Enzymes: No results for input(s): CKTOTAL, CKMB, CKMBINDEX, TROPONINI in the last 168 hours. BNP (last 3 results) No results for  input(s): PROBNP in the last 8760 hours. HbA1C: No results for input(s): HGBA1C in the last 72 hours. CBG: No results for input(s): GLUCAP in the last 168 hours. Lipid Profile: No results for input(s): CHOL, HDL, LDLCALC, TRIG, CHOLHDL, LDLDIRECT in the last 72 hours. Thyroid Function Tests: No results for input(s): TSH, T4TOTAL, FREET4, T3FREE, THYROIDAB in the last 72 hours. Anemia Panel: No results for input(s): VITAMINB12, FOLATE, FERRITIN, TIBC, IRON, RETICCTPCT in the last 72 hours. Sepsis Labs: No results for input(s): PROCALCITON, LATICACIDVEN in  the last 168 hours.  Recent Results (from the past 240 hour(s))  Resp Panel by RT-PCR (Flu A&B, Covid) Nasopharyngeal Swab     Status: Abnormal   Collection Time: 04/08/21  8:18 AM   Specimen: Nasopharyngeal Swab; Nasopharyngeal(NP) swabs in vial transport medium  Result Value Ref Range Status   SARS Coronavirus 2 by RT PCR POSITIVE (A) NEGATIVE Final    Comment: RESULT CALLED TO, READ BACK BY AND VERIFIED WITH:  BRIANNA CHAPMON AT 1012 04/08/21 SDR (NOTE) SARS-CoV-2 target nucleic acids are DETECTED.  The SARS-CoV-2 RNA is generally detectable in upper respiratory specimens during the acute phase of infection. Positive results are indicative of the presence of the identified virus, but do not rule out bacterial infection or co-infection with other pathogens not detected by the test. Clinical correlation with patient history and other diagnostic information is necessary to determine patient infection status. The expected result is Negative.  Fact Sheet for Patients: BloggerCourse.comhttps://www.fda.gov/media/152166/download  Fact Sheet for Healthcare Providers: SeriousBroker.ithttps://www.fda.gov/media/152162/download  This test is not yet approved or cleared by the [place] FDA and  has been authorized for detection and/or diagnosis of SARS-CoV-2 by FDA under an Emergency Use Authorization (EUA).  This EUA will remain in effect (meaning this test can   be used) for the duration of  the COVID-19 declaration under Section 564(b)(1) of the Act, 21 U.S.C. section 360bbb-3(b)(1), unless the authorization is terminated or revoked sooner.     Influenza A by PCR NEGATIVE NEGATIVE Final   Influenza B by PCR NEGATIVE NEGATIVE Final    Comment: (NOTE) The Xpert Xpress SARS-CoV-2/FLU/RSV plus assay is intended as an aid in the diagnosis of influenza from Nasopharyngeal swab specimens and should not be used as a sole basis for treatment. Nasal washings and aspirates are unacceptable for Xpert Xpress SARS-CoV-2/FLU/RSV testing.  Fact Sheet for Patients: BloggerCourse.comhttps://www.fda.gov/media/152166/download  Fact Sheet for Healthcare Providers: SeriousBroker.ithttps://www.fda.gov/media/152162/download  This test is not yet approved or cleared by the [place] FDA and has been authorized for detection and/or diagnosis of SARS-CoV-2 by FDA under an Emergency Use Authorization (EUA). This EUA will remain in effect (meaning this test can be used) for the duration of the COVID-19 declaration under Section 564(b)(1) of the Act, 21 U.S.C. section 360bbb-3(b)(1), unless the authorization is terminated or revoked.  Performed at [facility], [address] [place], [place] [address]   Urine culture     Status: Abnormal   Collection Time: 04/08/21  8:18 AM   Specimen: Urine, Random  Result Value Ref Range Status   Specimen Description   Final    URINE, RANDOM Performed at [facility], [address] [place], [place] [address]    Special Requests   Final    NONE Performed at [facility], [address] [place], [place] [address]    Culture >=100,000 COLONIES/mL PROTEUS MIRABILIS (A)  Final   Report Status 04/10/2021 FINAL  Final   Organism ID, Bacteria PROTEUS MIRABILIS (A)  Final      Susceptibility   Proteus mirabilis - MIC*    AMPICILLIN <=2 SENSITIVE Sensitive     CEFAZOLIN <=4 SENSITIVE Sensitive     CEFEPIME <=0.12  SENSITIVE Sensitive     CEFTRIAXONE <=0.25 SENSITIVE Sensitive     CIPROFLOXACIN <=0.25 SENSITIVE Sensitive     GENTAMICIN <=1 SENSITIVE Sensitive     IMIPENEM 8 INTERMEDIATE Intermediate     NITROFURANTOIN 128 RESISTANT Resistant     TRIMETH/SULFA <=20 SENSITIVE Sensitive  AMPICILLIN/SULBACTAM <=2 SENSITIVE Sensitive     PIP/TAZO <=4 SENSITIVE Sensitive     * >=100,000 COLONIES/mL PROTEUS MIRABILIS  MRSA PCR Screening     Status: None   Collection Time: 04/08/21 12:43 PM   Specimen: Nasopharyngeal  Result Value Ref Range Status   MRSA by PCR NEGATIVE NEGATIVE Final    Comment:        The GeneXpert MRSA Assay (FDA approved for NASAL specimens only), is one component of a comprehensive MRSA colonization surveillance program. It is not intended to diagnose MRSA infection nor to guide or monitor treatment for MRSA infections. Performed at [facility], [address], [place], [place] [address]       Imaging Studies   No results found.   Medications   Scheduled Meds: . (feeding supplement) PROSource Plus  30 mL Oral BID BM  . carbidopa-levodopa  1 tablet Oral TID  . dextromethorphan-guaiFENesin  1 tablet Oral BID  . diltiazem  180 mg Oral Daily  . enoxaparin (LOVENOX) injection  40 mg Subcutaneous Q24H  . escitalopram  20 mg Oral Daily  . mirtazapine  30 mg Oral QHS  . multivitamin with minerals  1 tablet Oral Daily  . polyethylene glycol  17 g Oral Daily  . polyvinyl alcohol  1 drop Both Eyes BID  . rivastigmine  9.5 mg Transdermal Daily  . senna-docusate  1 tablet Oral QHS  . sodium chloride flush  3 mL Intravenous Q12H  . Vitamin D (Ergocalciferol)  50,000 Units Oral Weekly   Continuous Infusions: . sodium chloride    . sodium chloride         LOS: 9 days    Time spent: 20 minutes    Pennie Banter, DO [facility]  04/17/2021, 5:41 PM      If 7PM-7AM, please contact night-coverage. How to contact the [facility] Attending or  Consulting provider 7A - 7P or covering provider during after hours 7P -7A, for this patient?    1. Check the care team in [facility] and look for a) attending/consulting TR[facility] provider listed and b) the [facility] team listed 2. Log into [email or web address] and use [place]'s universal password to access. If you do not have the password, please contact the hospital operator. 3. Locate the [facility] provider you are looking for under [facility] and page to a number that you can be directly reached. 4. If you still have difficulty reaching the provider, please page the [facility] (Director on Call) for the Hospitalists listed on amion for assistance.

## 2021-04-17 NOTE — TOC Progression Note (Signed)
Transition of Care [facility]) - Progression Note    Patient Details  Name: Rivaldo Hineman MRN: [id number] Date of Birth: [birth year]  Transition of Care [facility]) CM/SW Contact  Caryn Section, RN Phone Number: 04/17/2021, 4:06 PM  Clinical Narrative:   Cherlyn Roberts applied for ref #[id number]   Expected Discharge Plan: Skilled Nursing Facility Barriers to Discharge: SNF Covid  Expected Discharge Plan and Services Expected Discharge Plan: Skilled Nursing Facility   Discharge Planning Services: CM Consult Post Acute Care Choice: Skilled Nursing Facility Living arrangements for the past 2 months: Assisted Living Facility                                       Social Determinants of Health (SDOH) Interventions    Readmission Risk Interventions No flowsheet data found.

## 2021-04-17 NOTE — Progress Notes (Signed)
Physical Therapy Treatment Patient Details Name: Justin Kim MRN: [id number] DOB: 01-Sep-1946 Today's Date: 04/17/2021    History of Present Illness Pt admitted for generalized weakness with AMS x 2 days. History includes Parkinson's dx, HTN, and lewy body dementia.  Pt COVID+.    PT Comments    Patient received in bed, he is agreeable to work with PT. Reports he is feeling a little better today. Patient is slow to respond. Requires max assist for bed mobility with decreased or slow initiation of movement. Patient attempted to stand x 2 reps with and without RW from elevated surface. Patient unable to get standing on either attempt. He will continue to benefit from skilled PT while here to improve functional mobility.      Follow Up Recommendations  SNF     Equipment Recommendations  None recommended by PT    Recommendations for Other Services       Precautions / Restrictions Precautions Precautions: Fall Precaution Comments: COVID/Airborne and contact precautions Restrictions Weight Bearing Restrictions: No    Mobility  Bed Mobility Overal bed mobility: Needs Assistance Bed Mobility: Supine to Sit;Sit to Supine     Supine to sit: Max assist Sit to supine: Max assist   General bed mobility comments: assist for sitting balance, getting feet on floor.    Transfers Overall transfer level: Needs assistance Equipment used: Rolling walker (2 wheeled);None Transfers: Sit to/from Stand Sit to Stand: Total assist;From elevated surface         General transfer comment: Patient has strong posterior lean in sitting and standing. Requires cues for leaning forward with attempts to stand. Attempted to stand 2x once with walker and once without with bed elevated. Patient able to clear bottom minimally. Unable to get fully standing or transferred into recliner.  Ambulation/Gait             General Gait Details: unable to ambulate   Stairs             Wheelchair  Mobility    Modified Rankin (Stroke Patients Only)       Balance Overall balance assessment: Needs assistance Sitting-balance support: Feet supported;Bilateral upper extremity supported Sitting balance-Leahy Scale: Poor Sitting balance - Comments: requires min assist for sitting and maintaining both feet on floor. Requires cues for hand placement   Standing balance support: Bilateral upper extremity supported;During functional activity Standing balance-Leahy Scale: Zero Standing balance comment: strong posterior lean in standing. faciliation for forward weight shifting and cues for hip and knee extension                            Cognition Arousal/Alertness: Awake/alert Behavior During Therapy: Flat affect Overall Cognitive Status: History of cognitive impairments - at baseline                                 General Comments: Patient is known to this therapist from [facility]. Patient ambulates with a rolling walker with supervision/Mod I at baseline. Patient is slow overall at baseline to respond and has low tone voice. Patient is usually oriented x 4 and able to follow all commands. Patient needs increased time for motor planning and processing this session and is able to follow commands with increased time.      Exercises      General Comments        Pertinent Vitals/Pain Pain Assessment: No/denies  pain    Home Living                      Prior Function            PT Goals (current goals can now be found in the care plan section) Acute Rehab PT Goals Patient Stated Goal: to get back to ALF PT Goal Formulation: With patient Time For Goal Achievement: 04/22/21 Potential to Achieve Goals: Fair Progress towards PT goals: Progressing toward goals    Frequency    Min 2X/week      PT Plan Current plan remains appropriate    Co-evaluation              AM-PAC PT "6 Clicks" Mobility   Outcome Measure  Help  needed turning from your back to your side while in a flat bed without using bedrails?: A Lot Help needed moving from lying on your back to sitting on the side of a flat bed without using bedrails?: A Lot Help needed moving to and from a bed to a chair (including a wheelchair)?: Total Help needed standing up from a chair using your arms (e.g., wheelchair or bedside chair)?: Total Help needed to walk in hospital room?: Total Help needed climbing 3-5 steps with a railing? : Total 6 Click Score: 8    End of Session Equipment Utilized During Treatment: Gait belt Activity Tolerance: Patient tolerated treatment well Patient left: in bed;with call bell/phone within reach;with bed alarm set Nurse Communication: Mobility status PT Visit Diagnosis: Muscle weakness (generalized) (M62.81);Other abnormalities of gait and mobility (R26.89);Unsteadiness on feet (R26.81)     Time: 1250-1318 PT Time Calculation (min) (ACUTE ONLY): 28 min  Charges:  $Therapeutic Activity: 23-37 mins                     Shikira Folino, PT, GCS 04/17/21,1:31 PM

## 2021-04-17 NOTE — Progress Notes (Signed)
Occupational Therapy Treatment Patient Details Name: Justin Kim MRN: [id number] DOB: 1946-07-15 Today's Date: 04/17/2021    History of present illness Pt admitted for generalized weakness with AMS x 2 days. History includes Parkinson's dx, HTN, and lewy body dementia.  Pt COVID+.   OT comments  Mr Renbarger was seen for OT treatment on this date. Upon arrival to room pt reclined in bed, reports fatigue from AM PT session but agreeable to bed level tx. Pt completed supine BUE exercises with yellow theraband as described below - required setup assistance and cues for technique. Pt completed 1 set x 30 reps of IS, instructed on frequency and use. Pt left in bed, alarm on and call bell in reach. Pt making progress toward goals. Pt continues to benefit from skilled OT services to maximize return to PLOF and minimize risk of future falls, injury, caregiver burden, and readmission. Will continue to follow POC. Discharge recommendation remains appropriate.    Follow Up Recommendations  SNF    Equipment Recommendations  Other (comment) (defer)    Recommendations for Other Services      Precautions / Restrictions Precautions Precautions: Fall Precaution Comments: COVID/Airborne and contact precautions Restrictions Weight Bearing Restrictions: No       Mobility Bed Mobility Overal bed mobility: Needs Assistance   General bed mobility comments: MAX A adjust torso, deferred mobility citing fatigue from AM session with PT          ADL either performed or assessed with clinical judgement   ADL Overall ADL's : Needs assistance/impaired                                       General ADL Comments: MAX A to adjust torso at bed level               Cognition Arousal/Alertness: Awake/alert Behavior During Therapy: Flat affect Overall Cognitive Status: History of cognitive impairments - at baseline                                 General Comments:  increased time fo rmotor planning        Exercises Exercises: Other exercises General Exercises - Upper Extremity Shoulder Flexion: AROM;Strengthening;Both;10 reps;Supine;Theraband Theraband Level (Shoulder Flexion): Level 1 (Yellow) Shoulder Extension: AROM;Strengthening;Both;10 reps;Supine;Theraband Theraband Level (Shoulder Extension): Level 1 (Yellow) Elbow Flexion: AROM;Strengthening;Both;10 reps;Supine;Theraband Theraband Level (Elbow Flexion): Level 1 (Yellow) Elbow Extension: AROM;Strengthening;Both;10 reps;Supine;Theraband Theraband Level (Elbow Extension): Level 1 (Yellow) Digit Composite Flexion: AROM;Strengthening;Both;10 reps;Supine;Squeeze ball Other Exercises Other Exercises: Pt educated re: OT role, HEP, IS (frequency and use) Other Exercises: Bed mobility, IS. Therex           Pertinent Vitals/ Pain       Pain Assessment: No/denies pain         Frequency  Min 1X/week        Progress Toward Goals  OT Goals(current goals can now be found in the care plan section)  Progress towards OT goals: Progressing toward goals  Acute Rehab OT Goals Patient Stated Goal: to get back to ALF OT Goal Formulation: With patient Time For Goal Achievement: 04/23/21 Potential to Achieve Goals: Fair ADL Goals Pt Will Perform Grooming: with modified independence;sitting Pt Will Perform Lower Body Dressing: with modified independence;sitting/lateral leans Pt Will Transfer to Toilet: stand pivot transfer;bedside commode;with min assist  Plan Discharge plan  remains appropriate;Frequency remains appropriate       AM-PAC OT "6 Clicks" Daily Activity     Outcome Measure   Help from another person eating meals?: A Little Help from another person taking care of personal grooming?: A Little Help from another person toileting, which includes using toliet, bedpan, or urinal?: A Lot Help from another person bathing (including washing, rinsing, drying)?: A Lot Help from another  person to put on and taking off regular upper body clothing?: A Lot Help from another person to put on and taking off regular lower body clothing?: A Lot 6 Click Score: 14    End of Session    OT Visit Diagnosis: Unsteadiness on feet (R26.81);Muscle weakness (generalized) (M62.81);Feeding difficulties (R63.3);Other symptoms and signs involving cognitive function   Activity Tolerance Patient limited by fatigue   Patient Left in bed;with call bell/phone within reach;with bed alarm set   Nurse Communication Mobility status        Time: 2992-4268 OT Time Calculation (min): 28 min  Charges: OT General Charges $OT Visit: 1 Visit OT Treatments $Therapeutic Exercise: 23-37 mins  Kathie Dike, M.S. OTR/L  04/17/21, 2:41 PM  ascom [phone]

## 2021-04-18 DIAGNOSIS — R531 Weakness: Secondary | ICD-10-CM | POA: Diagnosis not present

## 2021-04-18 MED ORDER — POLYETHYLENE GLYCOL 3350 17 G PO PACK: 17.0000 g | PACK | Freq: Every day | ORAL | Status: DC | PRN

## 2021-04-18 MED ORDER — SENNOSIDES-DOCUSATE SODIUM 8.6-50 MG PO TABS: 1.0000 | ORAL_TABLET | Freq: Every evening | ORAL | Status: DC | PRN

## 2021-04-18 NOTE — Progress Notes (Signed)
PROGRESS NOTE    Justin Kim   ALP:[id number]  DOB: January 19, 1946  PCP: Pcp, No    DOA: 04/08/2021 LOS: 10   Brief Narrative   Justin Kim is a 75 y.o. male with medical history significant for Parkinson's disease with Lewy body dementia and hypertension who was brought to the ER by EMS for evaluation of weakness and disorientation for about 2 days.  Patient usually ambulates with a rolling walker has been too weak to do that.  No history of recent falls.  His brother-in-law was at the bedside states that he has difficulty with ambulation due to his Parkinson's disease.  PT recommending SNF- need to wait until 10 day COVID isolation period is complete before moving to SNF.  Anticipate d/c Monday 4/25.    Assessment & Plan   Principal Problem:   Generalized weakness Active Problems:   Acute lower UTI   Dementia due to Parkinson's disease without behavioral disturbance (HCC)   Parkinson disease (HCC)   Unspecified atrial fibrillation (HCC)   COVID-19 virus detected   Generalized weakness -multifactorial, secondary to recent UTI in addition to progression of Parkinson's disease.  PT and OT recommend SNF for rehab. -- Anticipate DC to SNF Monday 4/25 once off COVID isolation  UTI 2/2 Proteus mirabilis -present on admission with pyuria, patient reporting dysuria.  Patient reports symptoms resolved. Treated with Rocephin>> Keflex based on culture sensitivities. Completed antibiotics 4/19.  Parkinson's disease with dementia without behavioral disturbance -  --Continue Sinemet, Exelon, Remeron --PT OT recommend SNF  History of A. Fib -intermittently with RVR but does not sustain.  Not on anticoagulation due to high fall risk.  TSH was within normal. -- Continue Cardizem  COVID-19 infection -patient screened at time of admission and was positive for COVID-19.  He is vaccinated.  He reported productive cough.  Has not been hypoxic.  Monitoring clinically without COVID-specific  treatment and he is stable. -- Mucinex, Tessalon Perles    Patient BMI: Body mass index is 23.77 kg/m.   DVT prophylaxis: enoxaparin (LOVENOX) injection 40 mg Start: 04/08/21 1230   Diet:  Diet Orders (From admission, onward)    Start     Ordered   04/09/21 0950  Diet regular Room service appropriate? Yes with Assist; Fluid consistency: Thin  Diet effective now       Question Answer Comment  Room service appropriate? Yes with Assist   Fluid consistency: Thin      04/09/21 0950            Code Status: Full Code    Subjective 04/18/21    Patient awake laying in bed when seen today. Reports constipation resolved, stools too loose now, asks to back off stool softeners.  No other acute complaints.  Ready to get out of hospital    Disposition Plan & Communication   Status is: Inpatient  Remains inpatient appropriate because:Requires SNF and unable to discharge until past 10-day COVID-19 isolation.  Anticipate DC Monday 4/25.   Dispo: The patient is from: Home              Anticipated d/c is to: SNF              Patient currently is medically stable to d/c.   Difficult to place patient No  Family Communication:  Sister at bedside on rounds today   Consults, Procedures, Significant Events   Consultants:   none  Procedures:   none  Antimicrobials:  Anti-infectives (From admission, onward)  Start     Dose/Rate Route Frequency Ordered Stop   04/12/21 1000  cephALEXin (KEFLEX) capsule 500 mg  Status:  Discontinued        500 mg Oral 2 times daily 04/11/21 1930 04/15/21 0801   04/09/21 1100  cefTRIAXone (ROCEPHIN) 1 g in sodium chloride 0.9 % 100 mL IVPB  Status:  Discontinued        1 g 200 mL/hr over 30 Minutes Intravenous Every 24 hours 04/08/21 1220 04/11/21 1930   04/08/21 1045  cefTRIAXone (ROCEPHIN) 1 g in sodium chloride 0.9 % 100 mL IVPB        1 g 200 mL/hr over 30 Minutes Intravenous  Once 04/08/21 1044 04/08/21 1148        Micro    Objective    Vitals:   04/18/21 0421 04/18/21 0751 04/18/21 1141 04/18/21 1542  BP: 121/71 (!) 140/105 119/76 111/71  Pulse: 73 85 78 73  Resp: 14 14 17 16   Temp: 98.4 F (36.9 C) 98.7 F (37.1 C) 99.8 F (37.7 C) 98.5 F (36.9 C)  TempSrc: Oral Oral Oral Oral  SpO2: 99% 100% 96% 97%  Weight:      Height:        Intake/Output Summary (Last 24 hours) at 04/18/2021 1658 Last data filed at 04/18/2021 1200 Gross per 24 hour  Intake --  Output 684 ml  Net -684 ml   Filed Weights   04/08/21 0816 04/15/21 1500  Weight: 81.6 kg 77.3 kg    Physical Exam:  General exam: awake, alert, no acute distress Respiratory system: normal respiratory effort, on room air. Cardiovascular system: regular rate, no pedal edema.   Central nervous system: normal speech, grossly nonfocal exam Extremities: no lower extremity edema, no cyanosis   Labs   Data Reviewed: I have personally reviewed following labs and imaging studies  CBC: Recent Labs  Lab 04/12/21 0320 04/13/21 0319  WBC 6.1 5.6  HGB 13.4 14.2  HCT 39.6 40.4  MCV 95.2 93.5  PLT 142* 152   Basic Metabolic Panel: Recent Labs  Lab 04/12/21 0320 04/13/21 0319  NA 140 141  K 3.7 3.6  CL 105 107  CO2 26 27  GLUCOSE 109* 102*  BUN 35* 29*  CREATININE 0.95 0.95  CALCIUM 8.6* 8.5*  MG 2.3 2.1   GFR: Estimated Creatinine Clearance: 71.6 mL/min (by C-G formula based on SCr of 0.95 mg/dL). Liver Function Tests: No results for input(s): AST, ALT, ALKPHOS, BILITOT, PROT, ALBUMIN in the last 168 hours. No results for input(s): LIPASE, AMYLASE in the last 168 hours. No results for input(s): AMMONIA in the last 168 hours. Coagulation Profile: No results for input(s): INR, PROTIME in the last 168 hours. Cardiac Enzymes: No results for input(s): CKTOTAL, CKMB, CKMBINDEX, TROPONINI in the last 168 hours. BNP (last 3 results) No results for input(s): PROBNP in the last 8760 hours. HbA1C: No results for input(s): HGBA1C in the last 72  hours. CBG: No results for input(s): GLUCAP in the last 168 hours. Lipid Profile: No results for input(s): CHOL, HDL, LDLCALC, TRIG, CHOLHDL, LDLDIRECT in the last 72 hours. Thyroid Function Tests: No results for input(s): TSH, T4TOTAL, FREET4, T3FREE, THYROIDAB in the last 72 hours. Anemia Panel: No results for input(s): VITAMINB12, FOLATE, FERRITIN, TIBC, IRON, RETICCTPCT in the last 72 hours. Sepsis Labs: No results for input(s): PROCALCITON, LATICACIDVEN in the last 168 hours.  No results found for this or any previous visit (from the past 240 hour(s)).  Imaging Studies   No results found.   Medications   Scheduled Meds: . (feeding supplement) PROSource Plus  30 mL Oral BID BM  . carbidopa-levodopa  1 tablet Oral TID  . dextromethorphan-guaiFENesin  1 tablet Oral BID  . diltiazem  180 mg Oral Daily  . enoxaparin (LOVENOX) injection  40 mg Subcutaneous Q24H  . escitalopram  20 mg Oral Daily  . mirtazapine  30 mg Oral QHS  . multivitamin with minerals  1 tablet Oral Daily  . polyvinyl alcohol  1 drop Both Eyes BID  . rivastigmine  9.5 mg Transdermal Daily  . sodium chloride flush  3 mL Intravenous Q12H  . Vitamin D (Ergocalciferol)  50,000 Units Oral Weekly   Continuous Infusions: . sodium chloride    . sodium chloride         LOS: 10 days    Time spent: 20 minutes    Pennie Banter, DO [facility]  04/18/2021, 4:58 PM      If 7PM-7AM, please contact night-coverage. How to contact the [facility] Attending or Consulting provider 7A - 7P or covering provider during after hours 7P -7A, for this patient?    1. Check the care team in [facility] and look for a) attending/consulting TRH provider listed and b) the [facility] team listed 2. Log into [email or web address] and use [place]'s universal password to access. If you do not have the password, please contact the hospital operator. 3. Locate the [facility] provider you are looking for under [facility] and page to a  number that you can be directly reached. 4. If you still have difficulty reaching the provider, please page the [facility] (Director on Call) for the Hospitalists listed on amion for assistance.

## 2021-04-19 DIAGNOSIS — R531 Weakness: Secondary | ICD-10-CM | POA: Diagnosis not present

## 2021-04-19 NOTE — Progress Notes (Signed)
PROGRESS NOTE    Seydina Holliman   HFW:[id number]  DOB: March 01, 1946  PCP: Pcp, No    DOA: 04/08/2021 LOS: 11   Brief Narrative   Justin Kim is a 75 y.o. male with medical history significant for Parkinson's disease with Lewy body dementia and hypertension who was brought to the ER by EMS for evaluation of weakness and disorientation for about 2 days.  Patient usually ambulates with a rolling walker has been too weak to do that.  No history of recent falls.  His brother-in-law was at the bedside states that he has difficulty with ambulation due to his Parkinson's disease.  PT recommending SNF- need to wait until 10 day COVID isolation period is complete before moving to SNF.  Anticipate d/c Monday 4/25.    Assessment & Plan   Principal Problem:   Generalized weakness Active Problems:   Acute lower UTI   Dementia due to Parkinson's disease without behavioral disturbance (HCC)   Parkinson disease (HCC)   Unspecified atrial fibrillation (HCC)   COVID-19 virus detected   Generalized weakness -multifactorial, secondary to recent UTI in addition to progression of Parkinson's disease.  PT and OT recommend SNF for rehab. -- Anticipate DC to SNF Monday 4/25 once off COVID isolation  UTI 2/2 Proteus mirabilis -present on admission with pyuria, patient reporting dysuria.  Patient reports symptoms resolved. Treated with Rocephin>> Keflex based on culture sensitivities. Completed antibiotics 4/19.  Parkinson's disease with dementia without behavioral disturbance -  --Continue Sinemet, Exelon, Remeron --PT OT recommend SNF  History of A. Fib -intermittently with RVR but does not sustain.  Not on anticoagulation due to high fall risk.  TSH was within normal. -- Continue Cardizem  COVID-19 infection -patient screened at time of admission and was positive for COVID-19.  He is vaccinated.  He reported productive cough.  Has not been hypoxic.  Monitoring clinically without COVID-specific  treatment and he is stable. -- Mucinex, Tessalon Perles    Patient BMI: Body mass index is 23.77 kg/m.   DVT prophylaxis: enoxaparin (LOVENOX) injection 40 mg Start: 04/08/21 1230   Diet:  Diet Orders (From admission, onward)    Start     Ordered   04/09/21 0950  Diet regular Room service appropriate? Yes with Assist; Fluid consistency: Thin  Diet effective now       Question Answer Comment  Room service appropriate? Yes with Assist   Fluid consistency: Thin      04/09/21 0950            Code Status: Full Code    Subjective 04/19/21    Patient reports he feels well.  No acute complaints including F/C, CP, SOB, N/V/D.   Disposition Plan & Communication   Status is: Inpatient  Remains inpatient appropriate because:Requires SNF and unable to discharge until past 10-day COVID-19 isolation.  Anticipate DC Monday 4/25.   Dispo: The patient is from: Home              Anticipated d/c is to: SNF              Patient currently is medically stable to d/c.   Difficult to place patient No  Family Communication:  Sister at bedside on rounds today   Consults, Procedures, Significant Events   Consultants:   none  Procedures:   none  Antimicrobials:  Anti-infectives (From admission, onward)   Start     Dose/Rate Route Frequency Ordered Stop   04/12/21 1000  cephALEXin (KEFLEX) capsule 500 mg  Status:  Discontinued        500 mg Oral 2 times daily 04/11/21 1930 04/15/21 0801   04/09/21 1100  cefTRIAXone (ROCEPHIN) 1 g in sodium chloride 0.9 % 100 mL IVPB  Status:  Discontinued        1 g 200 mL/hr over 30 Minutes Intravenous Every 24 hours 04/08/21 1220 04/11/21 1930   04/08/21 1045  cefTRIAXone (ROCEPHIN) 1 g in sodium chloride 0.9 % 100 mL IVPB        1 g 200 mL/hr over 30 Minutes Intravenous  Once 04/08/21 1044 04/08/21 1148        Micro    Objective   Vitals:   04/19/21 0045 04/19/21 0433 04/19/21 0819 04/19/21 1141  BP: 115/83 124/88 135/86 127/74   Pulse: 73 71 83 89  Resp: 16 15 14 16   Temp: 98.3 F (36.8 C) (!) 97.3 F (36.3 C) 98.4 F (36.9 C) 98 F (36.7 C)  TempSrc: Oral Oral    SpO2: 97% 98% 98% 98%  Weight:      Height:        Intake/Output Summary (Last 24 hours) at 04/19/2021 1628 Last data filed at 04/19/2021 0500 Gross per 24 hour  Intake --  Output 900 ml  Net -900 ml   Filed Weights   04/08/21 0816 04/15/21 1500  Weight: 81.6 kg 77.3 kg    Physical Exam:  General exam: awake, alert, no acute distress Respiratory system: normal respiratory effort, on room air. Cardiovascular system: regular rate, no pedal edema.   GI: non-tender, non-distended, +bowel sounds Extremities: no lower extremity edema, no cyanosis   Labs   Data Reviewed: I have personally reviewed following labs and imaging studies  CBC: Recent Labs  Lab 04/13/21 0319  WBC 5.6  HGB 14.2  HCT 40.4  MCV 93.5  PLT 152   Basic Metabolic Panel: Recent Labs  Lab 04/13/21 0319  NA 141  K 3.6  CL 107  CO2 27  GLUCOSE 102*  BUN 29*  CREATININE 0.95  CALCIUM 8.5*  MG 2.1   GFR: Estimated Creatinine Clearance: 71.6 mL/min (by C-G formula based on SCr of 0.95 mg/dL). Liver Function Tests: No results for input(s): AST, ALT, ALKPHOS, BILITOT, PROT, ALBUMIN in the last 168 hours. No results for input(s): LIPASE, AMYLASE in the last 168 hours. No results for input(s): AMMONIA in the last 168 hours. Coagulation Profile: No results for input(s): INR, PROTIME in the last 168 hours. Cardiac Enzymes: No results for input(s): CKTOTAL, CKMB, CKMBINDEX, TROPONINI in the last 168 hours. BNP (last 3 results) No results for input(s): PROBNP in the last 8760 hours. HbA1C: No results for input(s): HGBA1C in the last 72 hours. CBG: No results for input(s): GLUCAP in the last 168 hours. Lipid Profile: No results for input(s): CHOL, HDL, LDLCALC, TRIG, CHOLHDL, LDLDIRECT in the last 72 hours. Thyroid Function Tests: No results for input(s):  TSH, T4TOTAL, FREET4, T3FREE, THYROIDAB in the last 72 hours. Anemia Panel: No results for input(s): VITAMINB12, FOLATE, FERRITIN, TIBC, IRON, RETICCTPCT in the last 72 hours. Sepsis Labs: No results for input(s): PROCALCITON, LATICACIDVEN in the last 168 hours.  No results found for this or any previous visit (from the past 240 hour(s)).    Imaging Studies   No results found.   Medications   Scheduled Meds: . (feeding supplement) PROSource Plus  30 mL Oral BID BM  . carbidopa-levodopa  1 tablet Oral TID  . dextromethorphan-guaiFENesin  1 tablet Oral BID  .  diltiazem  180 mg Oral Daily  . enoxaparin (LOVENOX) injection  40 mg Subcutaneous Q24H  . escitalopram  20 mg Oral Daily  . mirtazapine  30 mg Oral QHS  . multivitamin with minerals  1 tablet Oral Daily  . polyvinyl alcohol  1 drop Both Eyes BID  . rivastigmine  9.5 mg Transdermal Daily  . sodium chloride flush  3 mL Intravenous Q12H  . Vitamin D (Ergocalciferol)  50,000 Units Oral Weekly   Continuous Infusions: . sodium chloride    . sodium chloride         LOS: 11 days    Time spent: 20 minutes    Pennie Banter, DO [facility]  04/19/2021, 4:28 PM      If 7PM-7AM, please contact night-coverage. How to contact the [facility] Attending or Consulting provider 7A - 7P or covering provider during after hours 7P -7A, for this patient?    1. Check the care team in [facility] and look for a) attending/consulting TRH provider listed and b) the [facility] team listed 2. Log into [email or web address] and use [place]'s universal password to access. If you do not have the password, please contact the hospital operator. 3. Locate the [facility] provider you are looking for under [facility] and page to a number that you can be directly reached. 4. If you still have difficulty reaching the provider, please page the [facility] (Director on Call) for the Hospitalists listed on amion for assistance.

## 2021-04-20 DIAGNOSIS — R531 Weakness: Secondary | ICD-10-CM | POA: Diagnosis not present

## 2021-04-20 DIAGNOSIS — N390 Urinary tract infection, site not specified: Secondary | ICD-10-CM | POA: Diagnosis not present

## 2021-04-20 DIAGNOSIS — U071 COVID-19: Secondary | ICD-10-CM | POA: Diagnosis not present

## 2021-04-20 DIAGNOSIS — G20 Parkinson's disease: Secondary | ICD-10-CM | POA: Diagnosis not present

## 2021-04-20 MED ORDER — HYDROCORTISONE (PERIANAL) 2.5 % EX CREA
TOPICAL_CREAM | Freq: Two times a day (BID) | CUTANEOUS | 0 refills | Status: DC
Start: 2021-04-20 — End: 2021-09-07

## 2021-04-20 MED ORDER — HYDROCORTISONE (PERIANAL) 2.5 % EX CREA
TOPICAL_CREAM | Freq: Two times a day (BID) | CUTANEOUS | Status: DC
  Filled 2021-04-20: qty 28.35

## 2021-04-20 MED ORDER — SENNOSIDES-DOCUSATE SODIUM 8.6-50 MG PO TABS: 1.0000 | ORAL_TABLET | Freq: Every evening | ORAL | Status: DC | PRN

## 2021-04-20 MED ORDER — ADULT MULTIVITAMIN W/MINERALS CH: 1.0000 | ORAL_TABLET | Freq: Every day | ORAL | Status: DC

## 2021-04-20 MED ORDER — ONDANSETRON HCL 4 MG PO TABS
4.0000 mg | ORAL_TABLET | Freq: Four times a day (QID) | ORAL | 0 refills | Status: DC | PRN
Start: 2021-04-20 — End: 2021-09-10

## 2021-04-20 MED ORDER — BENZONATATE 100 MG PO CAPS: 100.0000 mg | ORAL_CAPSULE | Freq: Two times a day (BID) | ORAL | 0 refills | Status: DC | PRN

## 2021-04-20 MED ORDER — ACETAMINOPHEN 325 MG PO TABS
650.0000 mg | ORAL_TABLET | Freq: Four times a day (QID) | ORAL | Status: AC | PRN
Start: 2021-04-20 — End: ?

## 2021-04-20 MED ORDER — POLYETHYLENE GLYCOL 3350 17 G PO PACK: 17.0000 g | PACK | Freq: Every day | ORAL | 0 refills | Status: DC | PRN

## 2021-04-20 MED ORDER — DILTIAZEM HCL ER COATED BEADS 180 MG PO CP24: 180.0000 mg | ORAL_CAPSULE | Freq: Every day | ORAL | Status: DC

## 2021-04-20 MED ORDER — DM-GUAIFENESIN ER 30-600 MG PO TB12: 1.0000 | ORAL_TABLET | Freq: Two times a day (BID) | ORAL | 0 refills | Status: AC

## 2021-04-20 MED ORDER — PROSOURCE PLUS PO LIQD: 30.0000 mL | Freq: Two times a day (BID) | ORAL | Status: AC

## 2021-04-20 NOTE — TOC Transition Note (Signed)
Transition of Care [facility]) - CM/SW Discharge Note   Patient Details  Name: Justin Kim MRN: [id number] Date of Birth: 1946-11-16  Transition of Care [facility] CM/SW Contact:  Allayne Butcher, RN Phone Number: 04/20/2021, 12:28 PM   Clinical Narrative:    Passr number received: [phone].  Patient will discharge to [facility] [place].  Patient going to room E9, bedside RN to call report to [phone].  Patient's sister updated on discharge today.  [facility] will be transporting patient to [facility] this afternoon, pick up scheduled for between 1430 and 1500.  Patient's sister updated on discharge.     Final next level of care: Skilled Nursing Facility Barriers to Discharge: Barriers Resolved   Patient Goals and CMS Choice Patient states their goals for this hospitalization and ongoing recovery are:: Family agrees to SNF for STR CMS Medicare.gov Compare Post Acute Care list provided to:: Patient Choice offered to / list presented to : Sibling,Patient  Discharge Placement PASRR number recieved: 04/20/21            Patient chooses bed at: [facility] Patient to be transferred to facility by: [facility] Name of family member notified: Sister Jan Patient and family notified of of transfer: 04/20/21  Discharge Plan and Services   Discharge Planning Services: CM Consult Post Acute Care Choice: Skilled Nursing Facility          DME Arranged: N/A DME Agency: NA       HH Arranged: NA          Social Determinants of Health (SDOH) Interventions     Readmission Risk Interventions No flowsheet data found.

## 2021-04-20 NOTE — Discharge Summary (Signed)
Physician Discharge Summary  Justin Kim GEX:[id number] DOB: [birth year] DOA: 04/08/2021  PCP: Pcp, No  Admit date: 04/08/2021 Discharge date: 04/20/2021  Admitted From: ALF Disposition:  SNF  Recommendations for Outpatient Follow-up:  1. Follow up with PCP in 1-2 weeks 2. Please obtain BMP/CBC in one week   Home Health: No  Equipment/Devices: None   Discharge Condition: Stable  CODE STATUS: Full  Diet recommendation: Regular      Discharge Diagnoses: Principal Problem:   Generalized weakness Active Problems:   Acute lower UTI   Dementia due to Parkinson's disease without behavioral disturbance (HCC)   Parkinson disease (HCC)   Unspecified atrial fibrillation (HCC)   COVID-19 virus detected    Summary of HPI and Hospital Course:  Justin Kim is a 75 y.o. male with medical history significant for Parkinson's disease with Lewy body dementia and hypertension who was brought to the ER by EMS for evaluation of weakness and disorientation for about 2 days.  Patient usually ambulates with a rolling walker has been too weak to do that.  No history of recent falls.  His brother-in-law was at the bedside states that he has difficulty with ambulation due to his Parkinson's disease.  Found to have a UTI and incidentally positive for Covid-19.  PT recommending SNF.  Patient's discharge to SNF was delayed due to requirement for 10 days isolation for Covid-19 infection.  Pt too profoundly weak to return to ALF at this time.       Generalized weakness -multifactorial, secondary to recent UTI in addition to progression of Parkinson's disease.  PT and OT recommend SNF for rehab. Stable for DC to SNF today, now off COVID isolation.  UTI 2/2 Proteus mirabilis -present on admission with pyuria, patient reporting dysuria.  Patient reports symptoms resolved. Treated with Rocephin>> Keflex based on culture sensitivities. Completed antibiotics 4/19.  Parkinson's disease with dementia  without behavioral disturbance -  --Continue Sinemet, Exelon, Remeron --PT OT recommend SNF for rehab prior to returning to ALF  History of A. Fib -intermittently with RVR but does not sustain.  Not on anticoagulation due to high fall risk.  TSH was within normal. -- Continue Cardizem  COVID-19 infection -patient screened at time of admission and was positive for COVID-19.  He is vaccinated.  He reported productive cough, this has improved. He was never hypoxic.  Patient is stable and without any Covid symptoms. -- Mucinex, Tessalon Perles   Discharge Instructions   Discharge Instructions    Call MD for:  extreme fatigue   Complete by: As directed    Call MD for:  persistant dizziness or light-headedness   Complete by: As directed    Call MD for:  persistant nausea and vomiting   Complete by: As directed    Call MD for:  severe uncontrolled pain   Complete by: As directed    Call MD for:  temperature >100.4   Complete by: As directed    Diet - low sodium heart healthy   Complete by: As directed    Increase activity slowly   Complete by: As directed      Allergies as of 04/20/2021   Not on File     Medication List    TAKE these medications   (feeding supplement) PROSource Plus liquid Take 30 mLs by mouth 2 (two) times daily between meals.   acetaminophen 325 MG tablet Commonly known as: TYLENOL Take 2 tablets (650 mg total) by mouth every 6 (six) hours as needed for mild  pain (or Fever >/= 101).   benzonatate 100 MG capsule Commonly known as: TESSALON Take 1 capsule (100 mg total) by mouth 2 (two) times daily as needed for cough.   carbidopa-levodopa 25-100 MG tablet Commonly known as: SINEMET IR Take 1 tablet by mouth 3 (three) times daily.   carboxymethylcellulose 1 % ophthalmic solution 1 drop 2 (two) times daily.   dextromethorphan-guaiFENesin 30-600 MG 12hr tablet Commonly known as: MUCINEX DM Take 1 tablet by mouth 2 (two) times daily for 7 days.    diltiazem 180 MG 24 hr capsule Commonly known as: CARDIZEM CD Take 1 capsule (180 mg total) by mouth daily. Start taking on: April 21, 2021 What changed:   medication strength  how much to take   escitalopram 20 MG tablet Commonly known as: LEXAPRO Take 1 tablet by mouth daily.   hydrocortisone 2.5 % rectal cream Commonly known as: ANUSOL-HC Place rectally 2 (two) times daily.   mirtazapine 30 MG tablet Commonly known as: REMERON Take 30 mg by mouth at bedtime.   multivitamin with minerals Tabs tablet Take 1 tablet by mouth daily. Start taking on: April 21, 2021   ondansetron 4 MG tablet Commonly known as: ZOFRAN Take 1 tablet (4 mg total) by mouth every 6 (six) hours as needed for nausea.   polyethylene glycol 17 g packet Commonly known as: MIRALAX / GLYCOLAX Take 17 g by mouth daily as needed for mild constipation.   rivastigmine 9.5 mg/24hr Commonly known as: EXELON 9.5 mg daily.   senna-docusate 8.6-50 MG tablet Commonly known as: Senokot-S Take 1 tablet by mouth at bedtime as needed for mild constipation.   Vitamin D (Ergocalciferol) 1.25 MG (50000 UNIT) Caps capsule Commonly known as: DRISDOL Take 1 capsule by mouth once a week.       Contact information for after-discharge care    Destination    [facility] .   Service: Skilled Nursing Contact information: [address] [place] [address] [phone]                 Not on File   If you experience worsening of your admission symptoms, develop shortness of breath, life threatening emergency, suicidal or homicidal thoughts you must seek medical attention immediately by calling 911 or calling your MD immediately  if symptoms less severe.    Please note   You were cared for by a hospitalist during your hospital stay. If you have any questions about your discharge medications or the care you received while you were in the hospital after you are  discharged, you can call the unit and asked to speak with the hospitalist on call if the hospitalist that took care of you is not available. Once you are discharged, your primary care physician will handle any further medical issues. Please note that NO REFILLS for any discharge medications will be authorized once you are discharged, as it is imperative that you return to your primary care physician (or establish a relationship with a primary care physician if you do not have one) for your aftercare needs so that they can reassess your need for medications and monitor your lab values.   Consultations:  none    Procedures/Studies: DG Chest 2 View  Result Date: 04/08/2021 CLINICAL DATA:  Pt to ED [facility] from [facility] for AMS x2 days. Hx parkinsons, HTNPt reports having fall, unsure of details, EMS did not receive info of fall with report. Pt alert and oriented on  arrival. Hx of UTIWheelchair use at home EXAM: CHEST - 2 VIEW COMPARISON:  None. FINDINGS: Cardiac silhouette top-normal in size. No mediastinal or hilar masses. No evidence of adenopathy. Clear lungs.  No pleural effusion or pneumothorax. Skeletal structures are demineralized but intact. IMPRESSION: No active cardiopulmonary disease. Electronically Signed   By: Amie Portland M.D.   On: 04/08/2021 08:39   CT Head Wo Contrast  Result Date: 04/08/2021 CLINICAL DATA:  Altered mental status.  Fall. EXAM: CT HEAD WITHOUT CONTRAST TECHNIQUE: Contiguous axial images were obtained from the base of the skull through the vertex without intravenous contrast. COMPARISON:  None. FINDINGS: Brain: There is atrophy and chronic small vessel disease changes. No acute intracranial abnormality. Specifically, no hemorrhage, hydrocephalus, mass lesion, acute infarction, or significant intracranial injury. Vascular: No hyperdense vessel or unexpected calcification. Skull: No acute calvarial abnormality. Sinuses/Orbits: Mucosal thickening in scattered ethmoid air  cells and the left maxillary sinus. No air-fluid levels. Other: None IMPRESSION: Atrophy, chronic microvascular disease. No acute intracranial abnormality. Chronic sinusitis. Electronically Signed   By: Charlett Nose M.D.   On: 04/08/2021 08:56   CT Cervical Spine Wo Contrast  Result Date: 04/08/2021 CLINICAL DATA:  Altered mental status.  Fall. EXAM: CT CERVICAL SPINE WITHOUT CONTRAST TECHNIQUE: Multidetector CT imaging of the cervical spine was performed without intravenous contrast. Multiplanar CT image reconstructions were also generated. COMPARISON:  None. FINDINGS: Alignment: No subluxation. Skull base and vertebrae: No acute fracture.  Mild osteopenia. Soft tissues and spinal canal: No prevertebral fluid or swelling. No visible canal hematoma. Disc levels: Early degenerative disc disease in the mid to lower cervical spine with early disc space narrowing and anterior spurring. Mild bilateral degenerative facet disease. Upper chest: No acute findings. Other: None IMPRESSION: Early degenerative changes in the cervical spine. Mild osteopenia. No acute bony abnormality. Electronically Signed   By: Charlett Nose M.D.   On: 04/08/2021 08:58       Subjective: Pt doing well this morning.  Having some hemorrhoid pain and requests something for that.  Otherwise denies pain, SOB, F/C, N/V/D or other complaints.    Discharge Exam: Vitals:   04/20/21 0757 04/20/21 1157  BP: 133/89 130/68  Pulse: 78 63  Resp: 16 16  Temp: 98.7 F (37.1 C) 98.2 F (36.8 C)  SpO2: 99% 96%   Vitals:   04/20/21 0003 04/20/21 0628 04/20/21 0757 04/20/21 1157  BP: 125/83 (!) 145/92 133/89 130/68  Pulse: 78 60 78 63  Resp: 16 18 16 16   Temp: 98.8 F (37.1 C) 98.2 F (36.8 C) 98.7 F (37.1 C) 98.2 F (36.8 C)  TempSrc: Oral Oral  Oral  SpO2: 97% 99% 99% 96%  Weight:      Height:        General: Pt is alert, awake, not in acute distress Cardiovascular: RRR, S1/S2 +, no rubs, no gallops Respiratory: CTA  bilaterally, no wheezing, no rhonchi Abdominal: Soft, NT, ND, bowel sounds + Extremities: no edema, no cyanosis    The results of significant diagnostics from this hospitalization (including imaging, microbiology, ancillary and laboratory) are listed below for reference.     Microbiology: No results found for this or any previous visit (from the past 240 hour(s)).   Labs: BNP (last 3 results) No results for input(s): BNP in the last 8760 hours. Basic Metabolic Panel: No results for input(s): NA, K, CL, CO2, GLUCOSE, BUN, CREATININE, CALCIUM, MG, PHOS in the last 168 hours. Liver Function Tests: No results for input(s): AST, ALT,  ALKPHOS, BILITOT, PROT, ALBUMIN in the last 168 hours. No results for input(s): LIPASE, AMYLASE in the last 168 hours. No results for input(s): AMMONIA in the last 168 hours. CBC: No results for input(s): WBC, NEUTROABS, HGB, HCT, MCV, PLT in the last 168 hours. Cardiac Enzymes: No results for input(s): CKTOTAL, CKMB, CKMBINDEX, TROPONINI in the last 168 hours. BNP: Invalid input(s): POCBNP CBG: No results for input(s): GLUCAP in the last 168 hours. D-Dimer No results for input(s): DDIMER in the last 72 hours. Hgb A1c No results for input(s): HGBA1C in the last 72 hours. Lipid Profile No results for input(s): CHOL, HDL, LDLCALC, TRIG, CHOLHDL, LDLDIRECT in the last 72 hours. Thyroid function studies No results for input(s): TSH, T4TOTAL, T3FREE, THYROIDAB in the last 72 hours.  Invalid input(s): FREET3 Anemia work up No results for input(s): VITAMINB12, FOLATE, FERRITIN, TIBC, IRON, RETICCTPCT in the last 72 hours. Urinalysis    Component Value Date/Time   COLORURINE YELLOW (A) 04/08/2021 0818   APPEARANCEUR CLOUDY (A) 04/08/2021 0818   LABSPEC 1.016 04/08/2021 0818   PHURINE 8.0 04/08/2021 0818   GLUCOSEU NEGATIVE 04/08/2021 0818   HGBUR MODERATE (A) 04/08/2021 0818   BILIRUBINUR NEGATIVE 04/08/2021 0818   KETONESUR 20 (A) 04/08/2021 0818    PROTEINUR NEGATIVE 04/08/2021 0818   NITRITE POSITIVE (A) 04/08/2021 0818   LEUKOCYTESUR LARGE (A) 04/08/2021 0818   Sepsis Labs Invalid input(s): PROCALCITONIN,  WBC,  LACTICIDVEN Microbiology No results found for this or any previous visit (from the past 240 hour(s)).   Time coordinating discharge: Over 30 minutes  SIGNED:   Pennie BanterKelly A Jake Goodson, DO [facility] 04/20/2021, 12:22 PM   If 7PM-7AM, please contact night-coverage [email or web address]

## 2021-04-20 NOTE — Care Management Important Message (Signed)
Important Message  Patient Details  Name: Justin Kim MRN: [id number] Date of Birth: [birth year]   Medicare Important Message Given:  Yes  Talked with HCPOA, [name], BIL via phone [phone], reviewed the Important Message from Medicare and he is in agreement with the discharge plan. Asked if he would like another copy of the form and he declined as I had previously sent him a copy via Secure e-mail.  I thanked him for his time.   [name] 04/20/2021, 9:58 AM

## 2021-04-20 NOTE — Progress Notes (Signed)
Attempted to call report to facility nurse. Left voice mail to call Grier Mitts, RN at [phone] for report.  Pt sister, Jan Hemphill, request flowers be sent. I contacted daughter and  Informed her that flowers could not been sent through [facility]. Told flowers would be left at nurses station for her to pick up. Sister was agreeable with plan, she stated it may be tomorrow, 04/21/2021 before she could pick them up.

## 2021-09-04 ENCOUNTER — Emergency Department: Payer: Medicare Other

## 2021-09-04 ENCOUNTER — Other Ambulatory Visit: Payer: Self-pay

## 2021-09-04 ENCOUNTER — Emergency Department
Admission: EM | Admit: 2021-09-04 | Discharge: 2021-09-04 | Disposition: A | Discharge status: Skilled Nursing Facility | Payer: Medicare Other | Source: Home / Self Care | Attending: Emergency Medicine | Admitting: Emergency Medicine

## 2021-09-04 DIAGNOSIS — R4182 Altered mental status, unspecified: Secondary | ICD-10-CM | POA: Diagnosis not present

## 2021-09-04 DIAGNOSIS — S8992XA Unspecified injury of left lower leg, initial encounter: Secondary | ICD-10-CM | POA: Insufficient documentation

## 2021-09-04 DIAGNOSIS — I10 Essential (primary) hypertension: Secondary | ICD-10-CM | POA: Insufficient documentation

## 2021-09-04 DIAGNOSIS — Z8616 Personal history of COVID-19: Secondary | ICD-10-CM | POA: Insufficient documentation

## 2021-09-04 DIAGNOSIS — G20 Parkinson's disease: Secondary | ICD-10-CM | POA: Insufficient documentation

## 2021-09-04 DIAGNOSIS — W19XXXA Unspecified fall, initial encounter: Secondary | ICD-10-CM

## 2021-09-04 DIAGNOSIS — W010XXA Fall on same level from slipping, tripping and stumbling without subsequent striking against object, initial encounter: Secondary | ICD-10-CM | POA: Insufficient documentation

## 2021-09-04 DIAGNOSIS — F0390 Unspecified dementia without behavioral disturbance: Secondary | ICD-10-CM | POA: Insufficient documentation

## 2021-09-04 DIAGNOSIS — S42102A Fracture of unspecified part of scapula, left shoulder, initial encounter for closed fracture: Secondary | ICD-10-CM | POA: Insufficient documentation

## 2021-09-04 DIAGNOSIS — G9341 Metabolic encephalopathy: Secondary | ICD-10-CM | POA: Diagnosis not present

## 2021-09-04 DIAGNOSIS — S4292XA Fracture of left shoulder girdle, part unspecified, initial encounter for closed fracture: Secondary | ICD-10-CM

## 2021-09-04 NOTE — ED Triage Notes (Signed)
Pt in via EMS from [place]. Pt fell 2 days ago and had an x-ray this am which showed a fx to his left arm.  Pt also with bruising to left arm.

## 2021-09-04 NOTE — ED Notes (Signed)
Request made for transport to the floor ?

## 2021-09-04 NOTE — ED Provider Notes (Signed)
[facility] Emergency Department Provider Note  ____________________________________________  Time seen: Approximately 5:06 PM  I have reviewed the triage vital signs and the nursing notes.   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  Patient unable to answer most questions.  Majority of history is provided by EMS  HPI Justin Kim is a 75 y.o. male who presents the emergency department via EMS from [facility] assisted living for complaint of a mechanical trip and fall.  Patient is a very poor historian, is able to verbalize that his arm and knee hurts but unable to provide any other details.  Patient has difficulty following commands at this time.  Patient has Parkinson's and reportedly fell and injured his left arm and right knee 2 days ago.  Reportedly there may be a fracture in the left arm however there is no previous documentation and the patient did not arrive in a splint or a sling.  Unsure whether patient hit his head or loss consciousness.  He was also complaining of right knee pain.  No other reported complaints.  Again patient is a poor historian and cannot answer further questions at this time.  There does not appear to be any other associated pain.       Past Medical History:  Diagnosis Date   Dementia (HCC)    Hypertension     Patient Active Problem List   Diagnosis Date Noted   Generalized weakness 04/08/2021   Acute lower UTI 04/08/2021   Dementia due to Parkinson's disease without behavioral disturbance (HCC) 04/08/2021   Parkinson disease (HCC) 04/08/2021   Unspecified atrial fibrillation (HCC) 04/08/2021   COVID-19 virus detected 04/08/2021    Past Surgical History:  Procedure Laterality Date   NO PAST SURGERIES      Prior to Admission medications   Medication Sig Start Date End Date Taking? Authorizing Provider  acetaminophen (TYLENOL) 325 MG tablet Take 2  tablets (650 mg total) by mouth every 6 (six) hours as needed for mild pain (or Fever >/= 101). 04/20/21   Esaw Grandchild A, DO  benzonatate (TESSALON) 100 MG capsule Take 1 capsule (100 mg total) by mouth 2 (two) times daily as needed for cough. 04/20/21   Pennie Banter, DO  carbidopa-levodopa (SINEMET IR) 25-100 MG tablet Take 1 tablet by mouth 3 (three) times daily. 03/13/21   [provider]  carboxymethylcellulose 1 % ophthalmic solution 1 drop 2 (two) times daily.    [provider]  diltiazem (CARDIZEM CD) 180 MG 24 hr capsule Take 1 capsule (180 mg total) by mouth daily. 04/21/21   Esaw Grandchild A, DO  escitalopram (LEXAPRO) 20 MG tablet Take 1 tablet by mouth daily. 01/17/21   [provider]  hydrocortisone (ANUSOL-HC) 2.5 % rectal cream Place rectally 2 (two) times daily. 04/20/21   Esaw Grandchild A, DO  mirtazapine (REMERON) 30 MG tablet Take 30 mg by mouth at bedtime. 03/13/21   [provider]  Multiple Vitamin (MULTIVITAMIN WITH MINERALS) TABS tablet Take 1 tablet by mouth daily. 04/21/21   Pennie Banter, DO  Nutritional Supplements (,FEEDING SUPPLEMENT, PROSOURCE PLUS) liquid Take 30 mLs by mouth 2 (two) times daily between meals. 04/20/21   Esaw Grandchild A, DO  ondansetron (ZOFRAN) 4 MG tablet Take 1 tablet (4 mg total) by mouth every 6 (six) hours as needed for nausea. 04/20/21   Esaw Grandchild A, DO  polyethylene glycol (MIRALAX / GLYCOLAX) 17  g packet Take 17 g by mouth daily as needed for mild constipation. 04/20/21   Pennie Banter, DO  rivastigmine (EXELON) 9.5 mg/24hr 9.5 mg daily. 03/16/21   [provider]  senna-docusate (SENOKOT-S) 8.6-50 MG tablet Take 1 tablet by mouth at bedtime as needed for mild constipation. 04/20/21   Pennie Banter, DO  Vitamin D, Ergocalciferol, (DRISDOL) 1.25 MG (50000 UNIT) CAPS capsule Take 1 capsule by mouth once a week. 03/13/21   [provider]    Allergies Patient has no  allergy information on record.  Family History  Family history unknown: Yes    Social History Social History   Tobacco Use   Smoking status: Unknown  Substance Use Topics   Alcohol use: Not Currently   Drug use: Not Currently     Review of Systems severely limited as patient is unable to provide majority of information.  Only definitive information is that patient is complaining of left arm and right knee pain.  Musculoskeletal: Possible left arm and right knee injury from mechanical fall   10 System ROS otherwise negative.  ____________________________________________   PHYSICAL EXAM:  VITAL SIGNS: ED Triage Vitals  Enc Vitals Group     BP 09/04/21 1531 111/87     Pulse Rate 09/04/21 1531 94     Resp 09/04/21 1531 16     Temp 09/04/21 1531 98.6 F (37 C)     Temp Source 09/04/21 1531 Oral     SpO2 09/04/21 1531 97 %     Weight 09/04/21 1533 170 lb (77.1 kg)     Height 09/04/21 1533 6' (1.829 m)     Head Circumference --      Peak Flow --      Pain Score 09/04/21 1536 7     Pain Loc --      Pain Edu? --      Excl. in GC? --      Constitutional: Alert but not oriented.generally appearing and in no acute distress. Eyes: Conjunctivae are normal. PERRL. EOMI. Head: Atraumatic.  No acute abrasions, lacerations, ecchymosis or hematoma noted.  No apparent tenderness to palpation.  No palpable abnormality or crepitus. ENT:      Ears:       Nose: No congestion/rhinnorhea.      Mouth/Throat: Mucous membranes are moist.  Neck: No stridor.  No apparent tenderness to palpation over the cervical spine.  Patient has good range of motion of the cervical spine at this time.  Cardiovascular: Normal rate, regular rhythm. Normal S1 and S2.  Good peripheral circulation. Respiratory: Normal respiratory effort without tachypnea or retractions. Lungs CTAB. Good air entry to the bases with no decreased or absent breath sounds. Gastrointestinal: Bowel sounds 4 quadrants. Soft and  nontender to palpation. No guarding or rigidity. No palpable masses. No distention. No CVA tenderness. Musculoskeletal: Limited range of motion to the left upper extremity otherwise good range of motion to the remaining 3 extremities.  No gross deformities appreciated.  No obvious deformity to the left upper extremity.  Limited range of motion due to pain.  Patient is tender throughout the humerus, forearm with no palpable abnormalities or deficits.  No palpable abnormality or tenderness to the shoulder.  Radial pulse intact.  Sensation appears to be intact at this time.  Examination of the right knee revealed no abrasions, lacerations or deformity.  Mild apparent tenderness to palpation along the anterior knee without palpable abnormality.  No ballottement.  Special testing not performed at  this time.  Does not appear to be any significant tenderness to bilateral hips.  No shortening or rotation of bilateral lower extremity.  Dorsalis pedis pulses sensation intact ankle bilateral lower extremities. Neurologic:  Normal speech and language for the patient. No gross focal neurologic deficits are appreciated.  Skin:  Skin is warm, dry and intact. No rash noted. Psychiatric: Mood and affect are reportedly at baseline.  Speech and behavior are reportedly at baseline.  Patient with Parkinson's dementia and poor historian with limited insight at this time.   ____________________________________________   LABS (all labs ordered are listed, but only abnormal results are displayed)  Labs Reviewed - No data to display ____________________________________________  EKG   ____________________________________________  RADIOLOGY I personally viewed and evaluated these images as part of my medical decision making, as well as reviewing the written report by the radiologist.  ED Provider Interpretation: Imaging is reviewed.  Patient has a impacted comminuted fracture of the left shoulder.  There is questionable  pubic rami cortical disruption.  He is complaining of no hip pain, no groin pain, nontender in the area.  Suspect remote injury.  DG Pelvis 1-2 Views  Result Date: 09/04/2021 CLINICAL DATA:  Larey Seat 2 days ago, pain EXAM: PELVIS - 1-2 VIEW COMPARISON:  None. FINDINGS: Single frontal view of the pelvis including both hips was obtained. Portions of the left lateral proximal femur are excluded by collimation. There are no acute displaced fractures. 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 Prior healed left superior and inferior pubic rami fractures are noted. Sacroiliac joints are unremarkable. Soft tissues are normal. IMPRESSION: 1. Bilateral hip osteoarthritis, left greater than right. 2. No acute displaced fracture. Electronically Signed   By: Sharlet Salina M.D.   On: 09/04/2021 18:55   DG Forearm Left  Result Date: 09/04/2021 CLINICAL DATA:  Larey Seat, left arm pain EXAM: LEFT FOREARM - 2 VIEW COMPARISON:  None. FINDINGS: Frontal and lateral views of the left forearm are obtained. No fracture, subluxation, or dislocation. Bones are osteopenic. Soft tissue swelling of the visualized portion of the left upper arm. IMPRESSION: 1. Soft tissue swelling.  No acute fracture. Electronically Signed   By: Sharlet Salina M.D.   On: 09/04/2021 18:57   DG Tibia/Fibula Right  Result Date: 09/04/2021 CLINICAL DATA:  Larey Seat, pain EXAM: RIGHT TIBIA AND FIBULA - 2 VIEW COMPARISON:  None. FINDINGS: Frontal and cross-table lateral views of the right tibia and fibula are obtained. No acute displaced fracture. Right knee and ankle are well aligned. Subcutaneous edema within the proximal right lower leg. IMPRESSION: 1. Soft tissue swelling.  No acute bony abnormality. Electronically Signed   By: Sharlet Salina M.D.   On: 09/04/2021 18:56   CT HEAD WO CONTRAST ( )  Result Date: 09/04/2021 CLINICAL DATA:  Fall 2 days ago. EXAM: CT HEAD WITHOUT CONTRAST CT CERVICAL SPINE WITHOUT CONTRAST TECHNIQUE: Multidetector  CT imaging of the head and cervical spine was performed following the standard protocol without intravenous contrast. Multiplanar CT image reconstructions of the cervical spine were also generated. COMPARISON:  CT head and cervical spine dated April 08, 2021. FINDINGS: CT HEAD FINDINGS Brain: No evidence of acute infarction, hemorrhage, hydrocephalus, extra-axial collection or mass lesion/mass effect. Stable atrophy and chronic microvascular ischemic changes. Vascular: Calcified atherosclerosis at the skullbase. No hyperdense vessel. Skull: Normal. Negative for fracture or focal lesion. Sinuses/Orbits: No acute finding. Chronic mucosal thickening in the ethmoid air cells and maxillary sinuses. Other: None. CT CERVICAL SPINE FINDINGS Alignment: Normal.  Skull base and vertebrae: No acute fracture. No primary bone lesion or focal pathologic process. Scattered small lucencies in the cervical vertebral bodies are unchanged and likely reflect osteopenia. Soft tissues and spinal canal: No prevertebral fluid or swelling. No visible canal hematoma. Disc levels: Unchanged mild disc height loss and endplate spurring at C5-C6 and C6-C7. Upper chest: Negative. Other: None. IMPRESSION: 1. No acute intracranial abnormality. Stable atrophy and chronic microvascular ischemic changes. 2. No acute cervical spine fracture or traumatic listhesis. Electronically Signed   By: Obie Dredge M.D.   On: 09/04/2021 19:14   CT Cervical Spine Wo Contrast  Result Date: 09/04/2021 CLINICAL DATA:  Fall 2 days ago. EXAM: CT HEAD WITHOUT CONTRAST CT CERVICAL SPINE WITHOUT CONTRAST TECHNIQUE: Multidetector CT imaging of the head and cervical spine was performed following the standard protocol without intravenous contrast. Multiplanar CT image reconstructions of the cervical spine were also generated. COMPARISON:  CT head and cervical spine dated April 08, 2021. FINDINGS: CT HEAD FINDINGS Brain: No evidence of acute infarction, hemorrhage,  hydrocephalus, extra-axial collection or mass lesion/mass effect. Stable atrophy and chronic microvascular ischemic changes. Vascular: Calcified atherosclerosis at the skullbase. No hyperdense vessel. Skull: Normal. Negative for fracture or focal lesion. Sinuses/Orbits: No acute finding. Chronic mucosal thickening in the ethmoid air cells and maxillary sinuses. Other: None. CT CERVICAL SPINE FINDINGS Alignment: Normal. Skull base and vertebrae: No acute fracture. No primary bone lesion or focal pathologic process. Scattered small lucencies in the cervical vertebral bodies are unchanged and likely reflect osteopenia. Soft tissues and spinal canal: No prevertebral fluid or swelling. No visible canal hematoma. Disc levels: Unchanged mild disc height loss and endplate spurring at C5-C6 and C6-C7. Upper chest: Negative. Other: None. IMPRESSION: 1. No acute intracranial abnormality. Stable atrophy and chronic microvascular ischemic changes. 2. No acute cervical spine fracture or traumatic listhesis. Electronically Signed   By: Obie Dredge M.D.   On: 09/04/2021 19:14   DG Lumbar Spine 1 View  Result Date: 09/04/2021 CLINICAL DATA:  Larey Seat 2 days ago EXAM: LUMBAR SPINE - 1 VIEW COMPARISON:  None. FINDINGS: Frontal view of the lumbar spine was obtained. Alignment appears grossly anatomic on this single frontal view. No obvious fracture. Bones are osteopenic. IMPRESSION: 1. No gross fracture on this single frontal view of the lumbar spine. Electronically Signed   By: Sharlet Salina M.D.   On: 09/04/2021 18:52   CT Shoulder Left Wo Contrast  Result Date: 09/04/2021 CLINICAL DATA:  Fall 2 days ago. EXAM: CT OF THE UPPER LEFT EXTREMITY WITHOUT CONTRAST TECHNIQUE: Multidetector CT imaging of the upper left extremity was performed according to the standard protocol. COMPARISON:  Left humerus x-rays from same day. FINDINGS: Bones/Joint/Cartilage Acute comminuted mildly displaced and impacted fracture involving the left  proximal humerus surgical neck and greater tuberosity. Left no dislocation. Mild-to-moderate glenohumeral joint space narrowing. No significant joint effusion. Ligaments Ligaments are suboptimally evaluated by CT. Muscles and Tendons Grossly intact.  Mild subscapularis muscle atrophy. Soft tissue No fluid collection or hematoma. No soft tissue mass. Dependent atelectasis in the left lower lobe. IMPRESSION: 1. Acute comminuted mildly displaced and impacted fracture involving the left proximal humerus surgical neck and greater tuberosity. Electronically Signed   By: Obie Dredge M.D.   On: 09/04/2021 19:20   DG Humerus Left  Result Date: 09/04/2021 CLINICAL DATA:  Larey Seat 2 days ago, left arm pain EXAM: LEFT HUMERUS - 2+ VIEW COMPARISON:  None. FINDINGS: Frontal and lateral views of the left humerus are obtained.  There is a comminuted impacted left humeral neck fracture with varus angulation at the fracture site. Inferior aspect of the fracture line may extend into the inferior margin of the glenohumeral joint. No evidence of joint effusion however. No subluxation or dislocation. Diffuse soft tissue swelling of the left upper arm. IMPRESSION: 1. Comminuted impacted fracture of the left humeral neck, with likely extension into the inferior aspect of the glenohumeral joint. Electronically Signed   By: Sharlet Salina M.D.   On: 09/04/2021 18:50   DG Femur Min 2 Views Left  Result Date: 09/04/2021 CLINICAL DATA:  Fell, pain EXAM: LEFT FEMUR 2 VIEWS COMPARISON:  None. FINDINGS: Frontal and lateral views of the left femur are obtained. There is moderate to severe left hip osteoarthritis with joint space narrowing and marginal osteophyte formation. Mild osteoarthritis of the left knee. There is possible cortical discontinuity involving the left superior and inferior pubic rami best seen on the frontal view of the left hip. This is indeterminate for acute or prior healed fracture, and correlation with physical exam  findings is recommended. If further imaging is desired, CT of the pelvis may be useful. IMPRESSION: 1. Possible acute fractures of the left superior and inferior pubic rami. Please correlate with physical exam findings. If further evaluation is desired, CT of the pelvis could be considered. 2. 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 Electronically Signed   By: Sharlet Salina M.D.   On: 09/04/2021 19:01   DG Femur Min 2 Views Right  Result Date: 09/04/2021 CLINICAL DATA:  Larey Seat 2 days ago, pain EXAM: RIGHT FEMUR 2 VIEWS COMPARISON:  None. FINDINGS: Frontal and lateral views of the right femur are obtained. No acute displaced fracture. Alignment of the right hip and knee is anatomic. Mild osteoarthritis of the right hip and knee. Soft tissues are unremarkable. IMPRESSION: 1. Osteoarthritis.  No acute fracture. Electronically Signed   By: Sharlet Salina M.D.   On: 09/04/2021 18:58    ____________________________________________    PROCEDURES  Procedure(s) performed:    Procedures    Medications - No data to display   ____________________________________________   INITIAL IMPRESSION / ASSESSMENT AND PLAN / ED COURSE  Pertinent labs & imaging results that were available during my care of the patient were reviewed by me and considered in my medical decision making (see chart for details).  Review of the [place] CSRS was performed in accordance of the [facility] prior to dispensing any controlled drugs.  Clinical Course as of 09/04/21 2011  Fri Sep 04, 2021  1717 Patient presents to the ED reportedly after a mechanical fall where he tripped and fell.  Initial complaints were left arm and right knee pain.  Primary historian was EMS.  Patient has Parkinson's dementia and is unable to contribute much to his HPI.  Patient was complaining of left arm and right knee pain.  Limited range of motion to the left upper extremity.  Given the nature of the injury with onset sure exact mechanism patient will have  imaging of the head, neck, lower back, bilateral hips, right knee and left arm.  At this time there was no reported fever, other changes prior to arrival.  Patient again is a poor historian but does not have any significant complaints at this time.  At this time will order imaging and reassess. [JC]  1931 Patient's finding consistent with left humeral head fracture.  There is also some questionable cortical disruption in the pubic rami.  Patient is still moving his  hip appropriately, not complaining of any hip pain and has no tenderness in this area.  Suspect remote injury at this time. [JC]  1932 Patient has fracture of the left shoulder.  This will be immobilized with sling/shoulder immobilizer.  I will refer to orthopedics for further management. [JC]    Clinical Course User Index [JC] Clarence Dunsmore, Delorise RoyalsJonathan D, PA-C          Patient's diagnosis is consistent with shoulder fracture, fall.  Patient reportedly fell 2 days ago.  According to EMS it was known that the patient had a fracture but there is no evidence of a visit to a regional hospital.  Patient had findings today on imaging that was consistent with left shoulder fracture.  There is no indication for emergent surgical intervention.  Patient will have sling applied.  Return to long-term care facility.  Given patient's comorbidities I do not feel that this will likely be a surgical repair.  After sling the patient will return to long-term care facility.  Follow-up with orthopedics.. Patient is given ED precautions to return to the ED for any worsening or new symptoms.     ____________________________________________  FINAL CLINICAL IMPRESSION(S) / ED DIAGNOSES  Final diagnoses:  Closed fracture of left shoulder, initial encounter      NEW MEDICATIONS STARTED DURING THIS VISIT:  ED Discharge Orders     None           This chart was dictated using voice recognition software/Dragon. Despite best efforts to proofread, errors  can occur which can change the meaning. Any change was purely unintentional.    Lanette HampshireCuthriell, Avalin Briley D, PA-C 09/04/21 2033    Sharman CheekStafford, Phillip, MD 09/04/21 [phone]

## 2021-09-04 NOTE — ED Triage Notes (Signed)
Pt comes via EMS from [facility] with c/o trip and fall. Pt states left arm and right knee pain

## 2021-09-06 ENCOUNTER — Observation Stay: Payer: Medicare Other

## 2021-09-06 ENCOUNTER — Other Ambulatory Visit: Payer: Self-pay

## 2021-09-06 ENCOUNTER — Inpatient Hospital Stay
Admission: EM | Admit: 2021-09-06 | Discharge: 2021-09-10 | DRG: 071 | Disposition: A | Discharge status: Skilled Nursing Facility | Payer: Medicare Other | Source: Skilled Nursing Facility | Attending: Internal Medicine | Admitting: Internal Medicine

## 2021-09-06 ENCOUNTER — Emergency Department: Payer: Medicare Other

## 2021-09-06 DIAGNOSIS — R531 Weakness: Secondary | ICD-10-CM | POA: Diagnosis not present

## 2021-09-06 DIAGNOSIS — S42202A Unspecified fracture of upper end of left humerus, initial encounter for closed fracture: Secondary | ICD-10-CM

## 2021-09-06 DIAGNOSIS — G20 Parkinson's disease: Secondary | ICD-10-CM | POA: Diagnosis not present

## 2021-09-06 DIAGNOSIS — R8290 Unspecified abnormal findings in urine: Secondary | ICD-10-CM | POA: Diagnosis present

## 2021-09-06 DIAGNOSIS — W1830XA Fall on same level, unspecified, initial encounter: Secondary | ICD-10-CM | POA: Diagnosis present

## 2021-09-06 DIAGNOSIS — I482 Chronic atrial fibrillation, unspecified: Secondary | ICD-10-CM

## 2021-09-06 DIAGNOSIS — F419 Anxiety disorder, unspecified: Secondary | ICD-10-CM | POA: Diagnosis present

## 2021-09-06 DIAGNOSIS — G9341 Metabolic encephalopathy: Principal | ICD-10-CM | POA: Diagnosis present

## 2021-09-06 DIAGNOSIS — E860 Dehydration: Secondary | ICD-10-CM | POA: Diagnosis present

## 2021-09-06 DIAGNOSIS — R5381 Other malaise: Secondary | ICD-10-CM | POA: Diagnosis present

## 2021-09-06 DIAGNOSIS — Z20822 Contact with and (suspected) exposure to covid-19: Secondary | ICD-10-CM | POA: Diagnosis present

## 2021-09-06 DIAGNOSIS — R4182 Altered mental status, unspecified: Secondary | ICD-10-CM | POA: Diagnosis present

## 2021-09-06 DIAGNOSIS — F32A Depression, unspecified: Secondary | ICD-10-CM | POA: Diagnosis present

## 2021-09-06 DIAGNOSIS — R410 Disorientation, unspecified: Secondary | ICD-10-CM

## 2021-09-06 DIAGNOSIS — I4821 Permanent atrial fibrillation: Secondary | ICD-10-CM | POA: Diagnosis present

## 2021-09-06 DIAGNOSIS — I4891 Unspecified atrial fibrillation: Secondary | ICD-10-CM | POA: Diagnosis present

## 2021-09-06 DIAGNOSIS — R1312 Dysphagia, oropharyngeal phase: Secondary | ICD-10-CM

## 2021-09-06 DIAGNOSIS — N390 Urinary tract infection, site not specified: Secondary | ICD-10-CM

## 2021-09-06 DIAGNOSIS — Z79899 Other long term (current) drug therapy: Secondary | ICD-10-CM

## 2021-09-06 DIAGNOSIS — S42212A Unspecified displaced fracture of surgical neck of left humerus, initial encounter for closed fracture: Secondary | ICD-10-CM | POA: Diagnosis present

## 2021-09-06 DIAGNOSIS — N179 Acute kidney failure, unspecified: Secondary | ICD-10-CM | POA: Diagnosis present

## 2021-09-06 DIAGNOSIS — F0280 Dementia in other diseases classified elsewhere without behavioral disturbance: Secondary | ICD-10-CM | POA: Diagnosis present

## 2021-09-06 DIAGNOSIS — I10 Essential (primary) hypertension: Secondary | ICD-10-CM | POA: Diagnosis present

## 2021-09-06 DIAGNOSIS — S42252A Displaced fracture of greater tuberosity of left humerus, initial encounter for closed fracture: Secondary | ICD-10-CM | POA: Diagnosis present

## 2021-09-06 LAB — TROPONIN I (HIGH SENSITIVITY)
Troponin I (High Sensitivity): 13 ng/L (ref <18)
Troponin I (High Sensitivity): 13 ng/L (ref <18)

## 2021-09-06 LAB — CBC WITH DIFFERENTIAL/PLATELET
Abs Immature Granulocytes: 0.04 10*3/uL (ref 0.00–0.07)
Basophils Absolute: 0 10*3/uL (ref 0.0–0.1)
Basophils Relative: 1 %
Eosinophils Absolute: 0.1 10*3/uL (ref 0.0–0.5)
Eosinophils Relative: 1 %
HCT: 34.6 % — ABNORMAL LOW (ref 39.0–52.0)
Hemoglobin: 12.3 g/dL — ABNORMAL LOW (ref 13.0–17.0)
Immature Granulocytes: 1 %
Lymphocytes Relative: 9 %
Lymphs Abs: 0.8 10*3/uL (ref 0.7–4.0)
MCH: 33.2 pg (ref 26.0–34.0)
MCHC: 35.5 g/dL (ref 30.0–36.0)
MCV: 93.5 fL (ref 80.0–100.0)
Monocytes Absolute: 0.9 10*3/uL (ref 0.1–1.0)
Monocytes Relative: 10 %
Neutro Abs: 6.9 10*3/uL (ref 1.7–7.7)
Neutrophils Relative %: 78 %
Platelets: 155 10*3/uL (ref 150–400)
RBC: 3.7 MIL/uL — ABNORMAL LOW (ref 4.22–5.81)
RDW: 12.7 % (ref 11.5–15.5)
WBC: 8.7 10*3/uL (ref 4.0–10.5)
nRBC: 0 % (ref 0.0–0.2)

## 2021-09-06 LAB — COMPREHENSIVE METABOLIC PANEL
ALT: 17 U/L (ref 0–44)
AST: 25 U/L (ref 15–41)
Albumin: 3.4 g/dL — ABNORMAL LOW (ref 3.5–5.0)
Alkaline Phosphatase: 67 U/L (ref 38–126)
Anion gap: 12 (ref 5–15)
BUN: 22 mg/dL (ref 8–23)
CO2: 20 mmol/L — ABNORMAL LOW (ref 22–32)
Calcium: 8.7 mg/dL — ABNORMAL LOW (ref 8.9–10.3)
Chloride: 111 mmol/L (ref 98–111)
Creatinine, Ser: 0.82 mg/dL (ref 0.61–1.24)
GFR, Estimated: >60 mL/min (ref >60)
Glucose, Bld: 112 mg/dL — ABNORMAL HIGH (ref 70–99)
Potassium: 4.1 mmol/L (ref 3.5–5.1)
Sodium: 143 mmol/L (ref 135–145)
Total Bilirubin: 1.7 mg/dL — ABNORMAL HIGH (ref 0.3–1.2)
Total Protein: 6.3 g/dL — ABNORMAL LOW (ref 6.5–8.1)

## 2021-09-06 LAB — URINALYSIS, COMPLETE (UACMP) WITH MICROSCOPIC
Glucose, UA: NEGATIVE mg/dL
Ketones, ur: 80 mg/dL — AB
Nitrite: NEGATIVE
Protein, ur: 30 mg/dL — AB
Specific Gravity, Urine: >1.03 — ABNORMAL HIGH (ref 1.005–1.030)
pH: 5.5 (ref 5.0–8.0)

## 2021-09-06 LAB — VITAMIN B12: Vitamin B-12: 286 pg/mL (ref 180–914)

## 2021-09-06 LAB — VITAMIN D 25 HYDROXY (VIT D DEFICIENCY, FRACTURES): Vit D, 25-Hydroxy: 123.33 ng/mL — ABNORMAL HIGH (ref 30–100)

## 2021-09-06 LAB — LACTIC ACID, PLASMA
Lactic Acid, Venous: 1 mmol/L (ref 0.5–1.9)
Lactic Acid, Venous: 1 mmol/L (ref 0.5–1.9)

## 2021-09-06 LAB — TSH: TSH: 0.801 u[IU]/mL (ref 0.350–4.500)

## 2021-09-06 LAB — RESP PANEL BY RT-PCR (FLU A&B, COVID) ARPGX2
Influenza A by PCR: NEGATIVE
Influenza B by PCR: NEGATIVE
SARS Coronavirus 2 by RT PCR: NEGATIVE

## 2021-09-06 LAB — PROCALCITONIN: Procalcitonin: <0.1 ng/mL

## 2021-09-06 LAB — MAGNESIUM: Magnesium: 2 mg/dL (ref 1.7–2.4)

## 2021-09-06 LAB — CK: Total CK: 197 U/L (ref 49–397)

## 2021-09-06 LAB — AMMONIA: Ammonia: 13 umol/L (ref 9–35)

## 2021-09-06 LAB — D-DIMER, QUANTITATIVE: D-Dimer, Quant: 6.42 ug/mL-FEU — ABNORMAL HIGH (ref 0.00–0.50)

## 2021-09-06 MED ORDER — SODIUM CHLORIDE 0.9 % IV BOLUS
1000.0000 mL | Freq: Once | INTRAVENOUS | Status: AC
  Administered 2021-09-06: 1000 mL via INTRAVENOUS

## 2021-09-06 MED ORDER — ACETAMINOPHEN 325 MG PO TABS
650.0000 mg | ORAL_TABLET | Freq: Four times a day (QID) | ORAL | Status: AC | PRN
  Filled 2021-09-06: qty 2

## 2021-09-06 MED ORDER — SODIUM CHLORIDE 0.9 % IV SOLN
1.0000 g | Freq: Once | INTRAVENOUS | Status: AC
  Administered 2021-09-06: 1 g via INTRAVENOUS
  Filled 2021-09-06: qty 10

## 2021-09-06 MED ORDER — KETOROLAC TROMETHAMINE 30 MG/ML IJ SOLN
15.0000 mg | Freq: Four times a day (QID) | INTRAMUSCULAR | Status: DC | PRN
  Administered 2021-09-06 – 2021-09-07 (×2): 15 mg via INTRAVENOUS
  Filled 2021-09-06 (×2): qty 1

## 2021-09-06 MED ORDER — MIRTAZAPINE 15 MG PO TABS
30.0000 mg | ORAL_TABLET | Freq: Every day | ORAL | Status: DC
  Administered 2021-09-06 – 2021-09-09 (×4): 30 mg via ORAL
  Filled 2021-09-06 (×4): qty 2

## 2021-09-06 MED ORDER — POLYETHYLENE GLYCOL 3350 17 G PO PACK: 17.0000 g | PACK | Freq: Every day | ORAL | Status: DC | PRN

## 2021-09-06 MED ORDER — MORPHINE SULFATE (PF) 2 MG/ML IV SOLN: 0.5000 mg | INTRAVENOUS | Status: DC | PRN

## 2021-09-06 MED ORDER — RIVASTIGMINE 9.5 MG/24HR TD PT24
9.5000 mg | MEDICATED_PATCH | Freq: Every day | TRANSDERMAL | Status: DC
  Administered 2021-09-07 – 2021-09-10 (×4): 9.5 mg via TRANSDERMAL
  Filled 2021-09-06 (×4): qty 1

## 2021-09-06 MED ORDER — ONDANSETRON HCL 4 MG/2ML IJ SOLN: 4.0000 mg | Freq: Four times a day (QID) | INTRAMUSCULAR | Status: DC | PRN

## 2021-09-06 MED ORDER — ONDANSETRON HCL 4 MG PO TABS: 4.0000 mg | ORAL_TABLET | Freq: Four times a day (QID) | ORAL | Status: DC | PRN

## 2021-09-06 MED ORDER — ACETAMINOPHEN 650 MG RE SUPP: 650.0000 mg | Freq: Four times a day (QID) | RECTAL | Status: AC | PRN

## 2021-09-06 MED ORDER — SODIUM CHLORIDE 0.9 % IV SOLN
2.0000 g | INTRAVENOUS | Status: DC
  Filled 2021-09-06: qty 20

## 2021-09-06 MED ORDER — DILTIAZEM HCL ER COATED BEADS 180 MG PO CP24
180.0000 mg | ORAL_CAPSULE | Freq: Every day | ORAL | Status: DC
  Administered 2021-09-07 – 2021-09-10 (×4): 180 mg via ORAL
  Filled 2021-09-06 (×5): qty 1

## 2021-09-06 MED ORDER — HEPARIN SODIUM (PORCINE) 5000 UNIT/ML IJ SOLN
5000.0000 [IU] | Freq: Three times a day (TID) | INTRAMUSCULAR | Status: DC
  Administered 2021-09-06 – 2021-09-10 (×12): 5000 [IU] via SUBCUTANEOUS
  Filled 2021-09-06 (×12): qty 1

## 2021-09-06 MED ORDER — ESCITALOPRAM OXALATE 20 MG PO TABS
20.0000 mg | ORAL_TABLET | Freq: Every day | ORAL | Status: DC
  Administered 2021-09-07 – 2021-09-10 (×4): 20 mg via ORAL
  Filled 2021-09-06 (×4): qty 1

## 2021-09-06 MED ORDER — CARBIDOPA-LEVODOPA 25-100 MG PO TABS
1.0000 | ORAL_TABLET | Freq: Three times a day (TID) | ORAL | Status: DC
  Administered 2021-09-06 – 2021-09-10 (×12): 1 via ORAL
  Filled 2021-09-06 (×15): qty 1

## 2021-09-06 NOTE — ED Notes (Signed)
Patient transported to CT 

## 2021-09-06 NOTE — ED Provider Notes (Signed)
[facility]  ____________________________________________   Event Date/Time   First MD Initiated Contact with Patient 09/06/21 1216     (approximate)  I have reviewed the triage vital signs and the nursing notes.   HISTORY  Chief Complaint Weakness    HPI Justin Kim is a 75 y.o. male with past medical history of Parkinson's disease and Lewy body dementia, atrial fibrillation not on anticoagulation, pretension who presents with generalized weakness.  Patient is not really able to provide much history.  In speaking with his Sister Jan, patient lives at an assisted living facility where he has provided his medications.  Several days ago he had a fall and was seen in the emergency department diagnosed with a humeral head fracture and discharged with a sling back to his assisted living facility.  She has checked on him each day and he has become progressively weaker.  He has been having difficulty taking his medications and has not been eating.  He has also seemed more confused and has been altered.  She does not think that he has been getting his medications.         Past Medical History:  Diagnosis Date   Dementia (HCC)    Hypertension     Patient Active Problem List   Diagnosis Date Noted   Altered mental status 09/06/2021   Generalized weakness 04/08/2021   Acute lower UTI 04/08/2021   Dementia due to Parkinson's disease without behavioral disturbance (HCC) 04/08/2021   Parkinson disease (HCC) 04/08/2021   Unspecified atrial fibrillation (HCC) 04/08/2021   COVID-19 virus detected 04/08/2021    Past Surgical History:  Procedure Laterality Date   NO PAST SURGERIES      Prior to Admission medications   Medication Sig Start Date End Date Taking? Authorizing Provider  carbidopa-levodopa (SINEMET IR) 25-100 MG tablet Take 1 tablet by mouth 3 (three) times daily. 03/13/21  Yes [provider]  carboxymethylcellul-glycerin (REFRESH RELIEVA)  0.5-0.9 % ophthalmic solution Place 1 drop into both eyes 2 (two) times daily.   Yes [provider]  carboxymethylcellulose (REFRESH PLUS) 0.5 % SOLN Place 1 drop into both eyes 2 (two) times daily as needed.   Yes [provider]  diltiazem (CARDIZEM CD) 180 MG 24 hr capsule Take 1 capsule (180 mg total) by mouth daily. 04/21/21  Yes Esaw Grandchild A, DO  escitalopram (LEXAPRO) 20 MG tablet Take 1 tablet by mouth daily. 01/17/21  Yes [provider]  mirtazapine (REMERON) 30 MG tablet Take 30 mg by mouth at bedtime. 03/13/21  Yes [provider]  rivastigmine (EXELON) 9.5 mg/24hr 9.5 mg daily. 03/16/21  Yes [provider]  acetaminophen (TYLENOL) 325 MG tablet Take 2 tablets (650 mg total) by mouth every 6 (six) hours as needed for mild pain (or Fever >/= 101). Patient not taking: No sig reported 04/20/21   Esaw Grandchild A, DO  benzonatate (TESSALON) 100 MG capsule Take 1 capsule (100 mg total) by mouth 2 (two) times daily as needed for cough. Patient not taking: No sig reported 04/20/21   Esaw Grandchild A, DO  carboxymethylcellulose 1 % ophthalmic solution 1 drop 2 (two) times daily. Patient not taking: Reported on 09/06/2021    [provider]  hydrocortisone (ANUSOL-HC) 2.5 % rectal cream Place rectally 2 (two) times daily. Patient not taking: No sig reported 04/20/21   Esaw Grandchild A, DO  Multiple Vitamin (MULTIVITAMIN WITH MINERALS) TABS tablet Take 1 tablet by mouth daily. Patient not taking: No  sig reported 04/21/21   Pennie BanterGriffith, Perseus Westall A, DO  Nutritional Supplements (,FEEDING SUPPLEMENT, PROSOURCE PLUS) liquid Take 30 mLs by mouth 2 (two) times daily between meals. 04/20/21   Esaw GrandchildGriffith, Satcha Storlie A, DO  ondansetron (ZOFRAN) 4 MG tablet Take 1 tablet (4 mg total) by mouth every 6 (six) hours as needed for nausea. Patient not taking: No sig reported 04/20/21   Esaw GrandchildGriffith, Timarie Labell A, DO  polyethylene glycol (MIRALAX / GLYCOLAX) 17 g packet Take 17 g by  mouth daily as needed for mild constipation. Patient not taking: No sig reported 04/20/21   Esaw GrandchildGriffith, Tracie Lindbloom A, DO  senna-docusate (SENOKOT-S) 8.6-50 MG tablet Take 1 tablet by mouth at bedtime as needed for mild constipation. Patient not taking: No sig reported 04/20/21   Esaw GrandchildGriffith, Chet Greenley A, DO  Vitamin D, Ergocalciferol, (DRISDOL) 1.25 MG (50000 UNIT) CAPS capsule Take 1 capsule by mouth once a week. 03/13/21   [provider]    Allergies Patient has no known allergies.  Family History  Family history unknown: Yes    Social History Social History   Tobacco Use   Smoking status: Unknown  Substance Use Topics   Alcohol use: Not Currently   Drug use: Not Currently    Review of Systems   Review of Systems  Unable to perform ROS: Mental status change   Physical Exam Updated Vital Signs BP 135/82 (BP Location: Left Arm)   Pulse 93   Temp 98.3 F (36.8 C) (Oral)   Resp 16   Wt 82.6 kg   SpO2 96%   BMI 24.68 kg/m   Physical Exam Vitals and nursing note reviewed.  Constitutional:      General: He is not in acute distress.    Comments: Appears chronically ill  HENT:     Head: Normocephalic and atraumatic.     Nose: Nose normal.     Mouth/Throat:     Comments: Extremely dry mucous membranes Eyes:     General: No scleral icterus.    Conjunctiva/sclera: Conjunctivae normal.  Cardiovascular:     Rate and Rhythm: Tachycardia present. Rhythm irregular.  Pulmonary:     Effort: Pulmonary effort is normal. No respiratory distress.     Breath sounds: Normal breath sounds. No wheezing.  Abdominal:     General: Abdomen is flat. There is no distension.     Tenderness: There is no abdominal tenderness. There is no guarding.  Musculoskeletal:        General: No deformity or signs of injury.     Cervical back: Normal range of motion.  Skin:    Coloration: Skin is not jaundiced or pale.  Neurological:     Comments: Is awake with eyes open, speaks very softly, very hard  to understand what he is saying Follows commands He is globally weak but moves all extremities spontaneously Pupils equal round reactive to light, extraocular movements intact  Psychiatric:        Mood and Affect: Mood normal.        Behavior: Behavior normal.     LABS (all labs ordered are listed, but only abnormal results are displayed)  Labs Reviewed  COMPREHENSIVE METABOLIC PANEL - Abnormal; Notable for the following components:      Result Value   CO2 20 (*)    Glucose, Bld 112 (*)    Calcium 8.7 (*)    Total Protein 6.3 (*)    Albumin 3.4 (*)    Total Bilirubin 1.7 (*)    All other components  within normal limits  URINALYSIS, COMPLETE (UACMP) WITH MICROSCOPIC - Abnormal; Notable for the following components:   Specific Gravity, Urine >1.030 (*)    Hgb urine dipstick TRACE (*)    Bilirubin Urine SMALL (*)    Ketones, ur 80 (*)    Protein, ur 30 (*)    Leukocytes,Ua TRACE (*)    Bacteria, UA RARE (*)    All other components within normal limits  CBC WITH DIFFERENTIAL/PLATELET - Abnormal; Notable for the following components:   RBC 3.70 (*)    Hemoglobin 12.3 (*)    HCT 34.6 (*)    All other components within normal limits  D-DIMER, QUANTITATIVE - Abnormal; Notable for the following components:   D-Dimer, Quant 6.42 (*)    All other components within normal limits  RESP PANEL BY RT-PCR (FLU A&B, COVID) ARPGX2  URINE CULTURE  RESPIRATORY PANEL BY PCR  CBC WITH DIFFERENTIAL/PLATELET  TSH  MAGNESIUM  PROCALCITONIN  AMMONIA  LACTIC ACID, PLASMA  CK  VITAMIN B12  VITAMIN B1  VITAMIN D 25 HYDROXY (VIT D DEFICIENCY, FRACTURES)  LACTIC ACID, PLASMA  BASIC METABOLIC PANEL  CBC  TROPONIN I (HIGH SENSITIVITY)  TROPONIN I (HIGH SENSITIVITY)   ____________________________________________  EKG  Atrial fibrillation with rapid ventricular response, normal axis, normal intervals, no acute ischemic changes ____________________________________________  RADIOLOGY I,  Randol Kern, personally viewed and evaluated these images (plain radiographs) as part of my medical decision making, as well as reviewing the written report by the radiologist.  ED MD interpretation: I reviewed the chest x-ray which does not show any acute cardiopulmonary process    ____________________________________________   PROCEDURES  Procedure(s) performed (including Critical Care):  Procedures   ____________________________________________   INITIAL IMPRESSION / ASSESSMENT AND PLAN / ED COURSE     Patient is a 75 year old male who presents with generalized weakness.  History largely obtained from his sister due to the patient's very soft voice and confusion.  He has been globally weak and not eating at his assisted living facility.  Patient is tachycardic and in atrial fibrillation with RVR.  He appears chronically ill and extremely dehydrated, he is barely able to stick out his tongue due to how dry his mouth is.  He has bruising on the left upper arm likely from his recent humeral head fracture.  He is otherwise neurovascular intact.  Abdomen is benign, no other signs of trauma.  Will work-up for underlying infectious metabolic etiologies of his acute decompensation.  We will also fluid resuscitate prior to any rate control as I suspect that his A. fib with RVR is largely in the setting of dehydration.  Will require admission.  Given a liter of fluids, heart rate improved to the low 100s.  His labs are largely reassuring, no leukocytosis.  COVID is negative.  CMP also within normal limits largely.  CT head without any acute abnormality as well as chest x-ray.  His urine is somewhat indeterminate there are rates leuks, 21-50 WBCs.  Given this is the only abnormality I have found I will treat as UTI.  Prior culture grew Proteus which was sensitive to ceftriaxone.  We will give him a dose of ceftriaxone as did not think he can tolerate p.o. at this time.  Given his acute  decompensation will admit for ongoing management.  Clinical Course as of 09/06/21 2107  Wynelle Link Sep 06, 2021  1457 Troponin I (High Sensitivity): 13 [KM]    Clinical Course User Index [KM] Georga Hacking,  MD     ____________________________________________   FINAL CLINICAL IMPRESSION(S) / ED DIAGNOSES  Final diagnoses:  Weakness     ED Discharge Orders     None        Note:  This document was prepared using Dragon voice recognition software and may include unintentional dictation errors.    Georga Hacking, MD 09/06/21 2107

## 2021-09-06 NOTE — ED Notes (Addendum)
Sister at bedside and updated to the best of this RNs ability. Admitting MD at bedside.

## 2021-09-06 NOTE — ED Triage Notes (Signed)
Pt comes from [facility]. Per EMS, the sister called and reported that pt has been feeling weak for a few days following his last visit for fracture in left arm. Has not been eating or drinking much either.

## 2021-09-06 NOTE — H&P (Addendum)
History and Physical   Justin Kim ZOX:[id number] DOB: 10/02/1946 DOA: 09/06/2021  PCP: Pcp, No  Patient [facility] from: [facility]   I have personally briefly reviewed patient's old medical [facility] in [facility] EMR.  Chief Concern: Altered mental status  HPI: Justin Kim is a 75 y.o. male with medical history significant for Lewy body dementia, Parkinson's disease, generalized weakness, debility, atrial fibrillation, not on anticoagulation due to increased risk of a fall, hypertension, who presents emergency department from facility for chief concerns of confusion and altered mental status.  At bedside, patient mumbles in a very low voice and difficult to understand.  Sister, was at bedside. Per sister, she found her brother at approximately 11:30 AM, slid half way halfway off the bed. Knees and legs were hanging off the bed. Per her knowledge, she does not know if there was head trauma, but patient was laying on the bed.  She presumes there was no head trauma overnight.  She also states that the med tech has not been able to feed him for about two days. Patient got one bite on 9/10 and drank water from straw.   Sister, Justin McgillJane Kim, who states he appears weak, confused and she denied reported nausea, vomiting, fever, diarrhea.   Sister states that patient is likely to have missed 2 days of his carbidopa-levodopa.  Social history: He is from assisted living, [facility]. He does not use tobacco and/or EtOH. 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   Vaccination history: He is vaccinated for covid 19, two shots and two boosters.   ROS: Unable to complete due to patient with moderate to severe dementia in setting of acute mental status  ED Course: Discussed with emergency medicine provider, patient requiring hospitalization for chief concerns of altered mental status.  Vitals in the emergency department was remarkable  for temperature 98.5, respiration rate 22, heart rate 74, blood pressure 174/81, SPO2 of 98% on room air.  Labs in the emergency department was remarkable for sodium level 143, potassium 4.1, chloride 111, bicarb 20, BUN 22, serum creatinine of 0.82, nonfasting blood glucose 112, WBC 8.7, hemoglobin 12.3, and platelets 155.  High sensitive troponin was 13.  UA was positive for trace leukocytes.  COVID/influenza A/influenza B PCR were negative.  In the emergency department patient was given ceftriaxone 1 g IV once, sodium chloride 2 L bolus.  Assessment/Plan  Principal Problem:   Altered mental status Active Problems:   Generalized weakness   Acute lower UTI   Dementia due to Parkinson's disease without behavioral disturbance (HCC)   Parkinson disease (HCC)   Unspecified atrial fibrillation (HCC)   # Altered mental status-etiology work-up in progress at this time, etiology is likely multifactorial including dehydration, acute kidney injury, urinary tract infection - Check ammonia level, B12, B1, vitamin D - CBC, BMP in the a.m.Marland Kitchen. - Status post ceftriaxone 1 g IV in the emergency department, continue ceftriaxone 2 g IV daily - Check stat D-dimer, MRI of the brain - Fall precaution, aspiration precautions - Admit to MedSurg, observation, telemetry for 12 hours  # Parkinson's disease-carbidopa-levodopa 25-100 mg p.o. 3 times daily - Rivastigmine 9.5 mg daily transdermal  # Depression/anxiety-escitalopram 20 mg daily, mirtazapine 30 mg nightly  # Atrial fibrillation-resume home diltiazem 180 mg p.o. daily - Patient currently not on oral anticoagulation due to risk of bleeding  # Ecchymosis of the left upper extremity-present on admission secondary to a fall on 09/04/2021 # Acute  comminuted mildly displaced and impacted fracture involving the left proximal humerus surgical neck and greater tuberosity - Fall on 09/05/2019 - Check CK - Per ED note, patient will be immobilized with a  sling/shoulder immobilizer however, when patient was returned to [facility], there was no immobilizer  - Pain control: Acetaminophen 650 mg every 6 hours as needed for mild pain, fever, headache; Toradol 15 mg IV every 6 hours as needed for moderate pain, 1 day ordered; morphine 0.5 mg IV every 6 hours as needed for severe pain, 3 doses ordered - Orthopedic surgeon has been consulted, Dr. Rosita Kea via secure chat  [facility] consulted per sister's request PT, OT ordered  Chart reviewed.   Hospitalization from 04/08/2021 to 04/20/2021: For generalized weakness.  Patient was also found to have incidental COVID-positive test.  Patient was treated for urinary tract infection with Proteus mirabilis, ceftriaxone which was then transition to Keflex based on culture sensitivity.  Patient completed antibiotic on 04/14/2021.  Hospital course was prolonged due to short-term rehab requirements of 10 days isolation for COVID-19 infection.  DVT prophylaxis: Heparin 5000 units subcutaneous every 8 hours Code Status: Full code  Diet: Heart healthy Family Communication: Updated sister, Justin Kim, HPOA at bedside Disposition Plan: Pending clinical course Consults called: None at this time Admission status: MedSurg, observation, telemetry  Past Medical History:  Diagnosis Date   Dementia (HCC)    Hypertension    Past Surgical History:  Procedure Laterality Date   NO PAST SURGERIES     Social History:  reports that he does not currently use alcohol. He reports that he does not currently use drugs. No history on file for tobacco use.  No Known Allergies Family History  Family history unknown: Yes   Family history: Family history reviewed and not pertinent  Prior to Admission medications   Medication Sig Start Date End Date Taking? Authorizing Provider  carbidopa-levodopa (SINEMET IR) 25-100 MG tablet Take 1 tablet by mouth 3 (three) times daily. 03/13/21  Yes [provider]   carboxymethylcellul-glycerin (REFRESH RELIEVA) 0.5-0.9 % ophthalmic solution Place 1 drop into both eyes 2 (two) times daily.   Yes [provider]  carboxymethylcellulose (REFRESH PLUS) 0.5 % SOLN Place 1 drop into both eyes 2 (two) times daily as needed.   Yes [provider]  diltiazem (CARDIZEM CD) 180 MG 24 hr capsule Take 1 capsule (180 mg total) by mouth daily. 04/21/21  Yes Esaw Grandchild A, DO  escitalopram (LEXAPRO) 20 MG tablet Take 1 tablet by mouth daily. 01/17/21  Yes [provider]  mirtazapine (REMERON) 30 MG tablet Take 30 mg by mouth at bedtime. 03/13/21  Yes [provider]  rivastigmine (EXELON) 9.5 mg/24hr 9.5 mg daily. 03/16/21  Yes [provider]  acetaminophen (TYLENOL) 325 MG tablet Take 2 tablets (650 mg total) by mouth every 6 (six) hours as needed for mild pain (or Fever >/= 101). Patient not taking: No sig reported 04/20/21   Esaw Grandchild A, DO  benzonatate (TESSALON) 100 MG capsule Take 1 capsule (100 mg total) by mouth 2 (two) times daily as needed for cough. Patient not taking: No sig reported 04/20/21   Esaw Grandchild A, DO  carboxymethylcellulose 1 % ophthalmic solution 1 drop 2 (two) times daily. Patient not taking: Reported on 09/06/2021    [provider]  hydrocortisone (ANUSOL-HC) 2.5 % rectal cream Place rectally 2 (two) times daily. Patient not taking: No sig reported 04/20/21   Pennie Banter, DO  Multiple  Vitamin (MULTIVITAMIN WITH MINERALS) TABS tablet Take 1 tablet by mouth daily. Patient not taking: No sig reported 04/21/21   Esaw Grandchild A, DO  Nutritional Supplements (,FEEDING SUPPLEMENT, PROSOURCE PLUS) liquid Take 30 mLs by mouth 2 (two) times daily between meals. 04/20/21   Esaw Grandchild A, DO  ondansetron (ZOFRAN) 4 MG tablet Take 1 tablet (4 mg total) by mouth every 6 (six) hours as needed for nausea. Patient not taking: No sig reported 04/20/21   Esaw Grandchild A, DO  polyethylene  glycol (MIRALAX / GLYCOLAX) 17 g packet Take 17 g by mouth daily as needed for mild constipation. Patient not taking: No sig reported 04/20/21   Esaw Grandchild A, DO  senna-docusate (SENOKOT-S) 8.6-50 MG tablet Take 1 tablet by mouth at bedtime as needed for mild constipation. Patient not taking: No sig reported 04/20/21   Esaw Grandchild A, DO  Vitamin D, Ergocalciferol, (DRISDOL) 1.25 MG (50000 UNIT) CAPS capsule Take 1 capsule by mouth once a week. 03/13/21   [provider]   Physical Exam: Vitals:   09/06/21 1223 09/06/21 1227 09/06/21 1232 09/06/21 1406  BP:    (!) 119/93  Pulse:  74  84  Resp:  (!) 22  (!) 25  Temp:  98.5 F (36.9 C)    TempSrc:      SpO2: 99% 98%  98%  Weight:   82.6 kg    Constitutional: appears age-appropriate, frail, acute and chronically ill, NAD, calm, comfortable Eyes: PERRL, lids and conjunctivae normal ENMT: Mucous membranes are dry. Posterior pharynx clear of any exudate or lesions.  Poor dentition.  Unable to assess hearing Neck: normal, supple, no masses, no thyromegaly Respiratory: clear to auscultation bilaterally, no wheezing, no crackles. Normal respiratory effort. No accessory muscle use.  Cardiovascular: Regular rate and rhythm, no murmurs / rubs / gallops. No extremity edema. 2+ pedal pulses. No carotid bruits.  Abdomen: no tenderness, no masses palpated, no hepatosplenomegaly. Bowel sounds positive.  Musculoskeletal: no clubbing / cyanosis. No joint deformity upper and lower extremities. Good ROM, no contractures, no atrophy. Normal muscle tone.  Skin: Left upper extremity fading ecchymosis-present on admission  Neurologic: Patient minimally follows commands.  Opens mouth.  Weak.  Difficulty here. Psychiatric: Unable to assess judgment and insight. Alert and oriented x 3.  Unable to assess mood  EKG: independently reviewed, showing atrial fibrillation with rate of 121, QTc 506  Chest x-ray on Admission: I personally reviewed and I  agree with radiologist reading of stable mild elevation of the left hemidiaphragm.  Clear lungs.  Mild thoracic spine degenerative changes.  No acute abnormality.  Labs on Admission: I have personally reviewed following labs  CBC: Recent Labs  Lab 09/06/21 1224 09/06/21 1422  WBC SPECIMEN CLOTTED KATIE MARTINEZ @1327  ON 09/06/21 SKL 8.7  NEUTROABS PENDING 6.9  HGB SPECIMEN CLOTTED KATIE MARTINEZ @1327  ON 09/06/21 SKL 12.3*  HCT SPECIMEN CLOTTED KATIE MARTINEZ @1327  ON 09/06/21 SKL 34.6*  MCV SPECIMEN CLOTTED KATIE MARTINEZ @1327  ON 09/06/21 SKL 93.5  PLT SPECIMEN CLOTTED KATIE MARTINEZ @1327  ON 09/06/21 SKL 155   Basic Metabolic Panel: Recent Labs  Lab 09/06/21 1224  NA 143  K 4.1  CL 111  CO2 20*  GLUCOSE 112*  BUN 22  CREATININE 0.82  CALCIUM 8.7*   GFR: Estimated Creatinine Clearance: 84.1 mL/min (by C-G formula based on SCr of 0.82 mg/dL).  Liver Function Tests: Recent Labs  Lab 09/06/21 1224  AST 25  ALT 17  ALKPHOS 67  BILITOT 1.7*  PROT 6.3*  ALBUMIN 3.4*   Urine analysis:    Component Value Date/Time   COLORURINE YELLOW 09/06/2021 1230   APPEARANCEUR CLEAR 09/06/2021 1230   LABSPEC >1.030 (H) 09/06/2021 1230   PHURINE 5.5 09/06/2021 1230   GLUCOSEU NEGATIVE 09/06/2021 1230   HGBUR TRACE (A) 09/06/2021 1230   BILIRUBINUR SMALL (A) 09/06/2021 1230   KETONESUR 80 (A) 09/06/2021 1230   PROTEINUR 30 (A) 09/06/2021 1230   NITRITE NEGATIVE 09/06/2021 1230   LEUKOCYTESUR TRACE (A) 09/06/2021 1230   Dr. Sedalia Muta [facility]  If 7PM-7AM, please contact overnight-coverage provider If 7AM-7PM, please contact day coverage provider [email or web address]  09/06/2021, 3:09 PM

## 2021-09-07 DIAGNOSIS — G20 Parkinson's disease: Secondary | ICD-10-CM | POA: Diagnosis present

## 2021-09-07 DIAGNOSIS — S42202A Unspecified fracture of upper end of left humerus, initial encounter for closed fracture: Secondary | ICD-10-CM

## 2021-09-07 DIAGNOSIS — S42212A Unspecified displaced fracture of surgical neck of left humerus, initial encounter for closed fracture: Secondary | ICD-10-CM | POA: Diagnosis present

## 2021-09-07 DIAGNOSIS — N390 Urinary tract infection, site not specified: Secondary | ICD-10-CM | POA: Diagnosis not present

## 2021-09-07 DIAGNOSIS — S42295A Other nondisplaced fracture of upper end of left humerus, initial encounter for closed fracture: Secondary | ICD-10-CM

## 2021-09-07 DIAGNOSIS — R5381 Other malaise: Secondary | ICD-10-CM | POA: Diagnosis present

## 2021-09-07 DIAGNOSIS — R8290 Unspecified abnormal findings in urine: Secondary | ICD-10-CM | POA: Diagnosis present

## 2021-09-07 DIAGNOSIS — F419 Anxiety disorder, unspecified: Secondary | ICD-10-CM | POA: Diagnosis present

## 2021-09-07 DIAGNOSIS — N179 Acute kidney failure, unspecified: Secondary | ICD-10-CM | POA: Diagnosis present

## 2021-09-07 DIAGNOSIS — E860 Dehydration: Secondary | ICD-10-CM | POA: Diagnosis present

## 2021-09-07 DIAGNOSIS — R4182 Altered mental status, unspecified: Secondary | ICD-10-CM | POA: Diagnosis present

## 2021-09-07 DIAGNOSIS — G9341 Metabolic encephalopathy: Secondary | ICD-10-CM | POA: Diagnosis present

## 2021-09-07 DIAGNOSIS — Z79899 Other long term (current) drug therapy: Secondary | ICD-10-CM | POA: Diagnosis not present

## 2021-09-07 DIAGNOSIS — F32A Depression, unspecified: Secondary | ICD-10-CM | POA: Diagnosis present

## 2021-09-07 DIAGNOSIS — F0280 Dementia in other diseases classified elsewhere without behavioral disturbance: Secondary | ICD-10-CM | POA: Diagnosis present

## 2021-09-07 DIAGNOSIS — I482 Chronic atrial fibrillation, unspecified: Secondary | ICD-10-CM | POA: Diagnosis not present

## 2021-09-07 DIAGNOSIS — S42252A Displaced fracture of greater tuberosity of left humerus, initial encounter for closed fracture: Secondary | ICD-10-CM | POA: Diagnosis present

## 2021-09-07 DIAGNOSIS — R410 Disorientation, unspecified: Secondary | ICD-10-CM | POA: Diagnosis not present

## 2021-09-07 DIAGNOSIS — W1830XA Fall on same level, unspecified, initial encounter: Secondary | ICD-10-CM | POA: Diagnosis present

## 2021-09-07 DIAGNOSIS — R531 Weakness: Secondary | ICD-10-CM | POA: Diagnosis not present

## 2021-09-07 DIAGNOSIS — Z20822 Contact with and (suspected) exposure to covid-19: Secondary | ICD-10-CM | POA: Diagnosis present

## 2021-09-07 DIAGNOSIS — I10 Essential (primary) hypertension: Secondary | ICD-10-CM | POA: Diagnosis present

## 2021-09-07 DIAGNOSIS — I4821 Permanent atrial fibrillation: Secondary | ICD-10-CM | POA: Diagnosis present

## 2021-09-07 LAB — CBC
HCT: 32.8 % — ABNORMAL LOW (ref 39.0–52.0)
Hemoglobin: 11.7 g/dL — ABNORMAL LOW (ref 13.0–17.0)
MCH: 33.9 pg (ref 26.0–34.0)
MCHC: 35.7 g/dL (ref 30.0–36.0)
MCV: 95.1 fL (ref 80.0–100.0)
Platelets: 145 10*3/uL — ABNORMAL LOW (ref 150–400)
RBC: 3.45 MIL/uL — ABNORMAL LOW (ref 4.22–5.81)
RDW: 12.8 % (ref 11.5–15.5)
WBC: 6.9 10*3/uL (ref 4.0–10.5)
nRBC: 0 % (ref 0.0–0.2)

## 2021-09-07 LAB — BASIC METABOLIC PANEL
Anion gap: 6 (ref 5–15)
BUN: 24 mg/dL — ABNORMAL HIGH (ref 8–23)
CO2: 24 mmol/L (ref 22–32)
Calcium: 8.3 mg/dL — ABNORMAL LOW (ref 8.9–10.3)
Chloride: 115 mmol/L — ABNORMAL HIGH (ref 98–111)
Creatinine, Ser: 0.82 mg/dL (ref 0.61–1.24)
GFR, Estimated: >60 mL/min (ref >60)
Glucose, Bld: 97 mg/dL (ref 70–99)
Potassium: 3.7 mmol/L (ref 3.5–5.1)
Sodium: 145 mmol/L (ref 135–145)

## 2021-09-07 LAB — URINE CULTURE: Culture: NO GROWTH

## 2021-09-07 MED ORDER — ENSURE ENLIVE PO LIQD
237.0000 mL | Freq: Three times a day (TID) | ORAL | Status: DC
  Administered 2021-09-07 – 2021-09-10 (×8): 237 mL via ORAL

## 2021-09-07 MED ORDER — TRAMADOL HCL 50 MG PO TABS
50.0000 mg | ORAL_TABLET | Freq: Four times a day (QID) | ORAL | Status: DC | PRN
  Administered 2021-09-09: 50 mg via ORAL
  Filled 2021-09-07: qty 1

## 2021-09-07 MED ORDER — METOPROLOL TARTRATE 25 MG PO TABS
25.0000 mg | ORAL_TABLET | Freq: Two times a day (BID) | ORAL | Status: DC
  Administered 2021-09-08 – 2021-09-10 (×4): 25 mg via ORAL
  Filled 2021-09-07 (×7): qty 1

## 2021-09-07 MED ORDER — SODIUM CHLORIDE 0.9 % IV SOLN
1.0000 g | INTRAVENOUS | Status: AC
  Administered 2021-09-07 – 2021-09-08 (×2): 1 g via INTRAVENOUS
  Filled 2021-09-07: qty 10
  Filled 2021-09-07: qty 1

## 2021-09-07 MED ORDER — SODIUM CHLORIDE 0.9 % IV SOLN: 1.0000 g | INTRAVENOUS | Status: DC

## 2021-09-07 MED ORDER — ADULT MULTIVITAMIN W/MINERALS CH
1.0000 | ORAL_TABLET | Freq: Every day | ORAL | Status: DC
  Administered 2021-09-08 – 2021-09-10 (×3): 1 via ORAL
  Filled 2021-09-07 (×3): qty 1

## 2021-09-07 MED ORDER — HYDROCODONE-ACETAMINOPHEN 5-325 MG PO TABS: 1.0000 | ORAL_TABLET | Freq: Four times a day (QID) | ORAL | Status: DC | PRN

## 2021-09-07 NOTE — Progress Notes (Signed)
   09/07/21 1650  Vitals  Temp 98.7 F (37.1 C)  Temp Source Oral  BP 95/63  MAP (mmHg) 74  BP Location Right Arm  BP Method Automatic  Patient Position (if appropriate) Lying  Pulse Rate (!) 105  Pulse Rate Source Monitor  Resp 20  Level of Consciousness  Level of Consciousness Alert  Oxygen Therapy  SpO2 95 %  O2 Device Room Air  MEWS Score  MEWS Temp 0  MEWS Systolic 1  MEWS Pulse 1  MEWS RR 0  MEWS LOC 0  MEWS Score 2  MEWS Score Color Yellow  Provider Notification  Provider Name/Title Dr. Allena Katz  Date Provider Notified 09/07/21  Time Provider Notified 1655  Notification Type Page (secure chat)  Notification Reason Other (Comment) (Low BP)  Provider response No new orders (continue to monitor)  Date of Provider Response 09/07/21 (1655)  Rapid Response Notification  Name of Rapid Response RN Notified Alcario Drought (Charge notified)  Date Rapid Response Notified 09/07/21  Time Rapid Response Notified 1703  Note  Observations  (Continue to monitor and update MD as needed)

## 2021-09-07 NOTE — Evaluation (Signed)
Physical Therapy Evaluation Patient Details Name: Justin Kim MRN: [id number] DOB: 08-28-1946 Today's Date: 09/07/2021  History of Present Illness  Pt is a 75 y.o. male presenting to ED 9/12 with AMS and weakness.  Recent ED visit d/t mechanical fall 9/9 with acute comminuted mildly displaced and impacted fx involving L proximal humerus surgical neck and greater tuberosity.  Per chart, pt also with possible acute fx's L superior and inferior pubic rami.  Pt now admitted with AMS, Parkinson's disease, a-fib, and ecchymosis of L UE.  PMH includes Lewy body dementia, htn, UTI, Parkinson's disease, a-fib, and COVID-19.  Clinical Impression  Prior to hospital admission and recent fall, pt was ambulatory with walker; lives at [facility].  Pt's sister reports pt has been working with PT about the last 6 weeks 3x/week and focusing on walking with walker and balance (pt was walking to dining hall with walker when he recently fell--someone will walk next to him but does not physically assist him).  Pt's sister also reporting pt favoring looking to L (turning head to L) which was new since a few days ago (on Friday) which was also noted during session.  Large L UE sling fitted to pt prior to OOB mobility.  Pt appearing very stiff and with difficulty initiating movement during session's activities.  Currently pt is max assist semi-supine to sitting edge of bed and SBA with sitting balance.  Deferred further mobility d/t pt's HR fluctuating between 114-149 bpm sitting edge of bed (nurse notified)--pt asymptomatic.  Pt would benefit from skilled PT to address noted impairments and functional limitations (see below for any additional details).  Upon hospital discharge, pt would benefit from SNF.       Recommendations for follow up therapy are one component of a multi-disciplinary discharge planning process, led by the attending physician.  Recommendations may be updated based on patient status, additional  functional criteria and insurance authorization.  Follow Up Recommendations SNF    Equipment Recommendations  Other (comment) (TBD at next facility)    Recommendations for Other Services OT consult     Precautions / Restrictions Precautions Precautions: Fall Precaution Comments: Aspiration Required Braces or Orthoses: Sling Restrictions Weight Bearing Restrictions: Yes LUE Weight Bearing: Non weight bearing Other Position/Activity Restrictions: L shoulder immobilizer at all times      Mobility  Bed Mobility Overal bed mobility: Needs Assistance Bed Mobility: Supine to Sit     Supine to sit: Max assist;HOB elevated     General bed mobility comments: assist for trunk and B LE's; vc's for technique    Transfers                 General transfer comment: deferred d/t pt's HR fluctuating between 114-149 bpm sitting edge of bed  Ambulation/Gait             General Gait Details: deferred d/t pt's HR fluctuating between 114-149 bpm sitting edge of bed  Stairs            Wheelchair Mobility    Modified Rankin (Stroke Patients Only)       Balance Overall balance assessment: Needs assistance Sitting-balance support: Feet supported;Single extremity supported Sitting balance-Leahy Scale: Fair Sitting balance - Comments: steady sitting with R UE support                                     Pertinent Vitals/Pain Pain Assessment: Faces  Faces Pain Scale: Hurts little more Pain Location: L shoulder Pain Descriptors / Indicators: Sore;Tender Pain Intervention(s): Limited activity within patient's tolerance;Monitored during session;Repositioned    Home Living Family/patient expects to be discharged to:: Skilled nursing facility                 Additional Comments: Lives at [facility]    Prior Function Level of Independence: Needs assistance   Gait / Transfers Assistance Needed: Pt reporting walking with assist vs using  w/c (pt with difficulty expanding on this information).  Pt's sister arrived and reported pt was recently walking with walker (pt was walking to dining room with walker when he recently fell and broke his shoulder).  Pt typically walks with someone close to him (but not holding onto him) and using RW.  Pt has been having PT 3x/week for about the last 6 weeks (working on balance and walking but if pt was exhausted they would use w/c if needed).     Comments: Pt reporting h/o neck issues; pt's sister reporting she noticed on Friday--a few days ago--that pt was looking to L/turning head to L in bed (was not favoring this position prior).     Hand Dominance        Extremity/Trunk Assessment   Upper Extremity Assessment Upper Extremity Assessment: Defer to OT evaluation (L UE placed in sling)    Lower Extremity Assessment Lower Extremity Assessment:  (pt's B LE's very stiff and pt with difficulty initiating movement)    Cervical / Trunk Assessment Cervical / Trunk Assessment: Other exceptions Cervical / Trunk Exceptions: pt's head position turned to L (pt able to come to midline and a little past towards R with AROM and cueing)  Communication   Communication:  (pt soft spoken)  Cognition Arousal/Alertness: Awake/alert Behavior During Therapy: Flat affect Overall Cognitive Status: Impaired/Different from baseline Area of Impairment: Orientation                 Orientation Level: Disoriented to;Place             General Comments: Pt oriented to person, month/year, and that his sister was worried about him (reason he was at hospital)--pt initially thought he was at [facility] [place] Comments General comments (skin integrity, edema, etc.): bruising L UE.  Nursing cleared pt for participation in physical therapy.  Pt agreeable to PT session.    Exercises     Assessment/Plan    PT Assessment Patient needs continued PT services  PT Problem List Decreased  strength;Decreased range of motion;Decreased activity tolerance;Decreased balance;Decreased mobility;Decreased cognition;Decreased knowledge of use of DME;Decreased knowledge of precautions;Pain       PT Treatment Interventions DME instruction;Gait training;Functional mobility training;Therapeutic activities;Therapeutic exercise;Balance training;Patient/family education    PT Goals (Current goals can be found in the Care Plan section)  Acute Rehab PT Goals Patient Stated Goal: to improve mobility PT Goal Formulation: With patient Time For Goal Achievement: 09/21/21 Potential to Achieve Goals: Fair    Frequency Min 2X/week   Barriers to discharge Decreased caregiver support      Co-evaluation               AM-PAC PT "6 Clicks" Mobility  Outcome Measure Help needed turning from your back to your side while in a flat bed without using bedrails?: A Lot Help needed moving from lying on your back to sitting on the side of a flat bed without using bedrails?: A Lot Help needed  moving to and from a bed to a chair (including a wheelchair)?: Total Help needed standing up from a chair using your arms (e.g., wheelchair or bedside chair)?: Total Help needed to walk in hospital room?: Total Help needed climbing 3-5 steps with a railing? : Total 6 Click Score: 8    End of Session Equipment Utilized During Treatment: Other (comment) (L shoulder immobilizer) Activity Tolerance: Patient tolerated treatment well;Other (comment) (but limited d/t HR fluctuating between 114-149 bpm) Patient left: Other (comment) (sitting on edge of bed with OT present) Nurse Communication: Mobility status;Precautions;Other (comment) (pts fluctuating HR that was elevated) PT Visit Diagnosis: Other abnormalities of gait and mobility (R26.89);Muscle weakness (generalized) (M62.81);History of falling (Z91.81);Difficulty in walking, not elsewhere classified (R26.2);Pain Pain - Right/Left: Left Pain - part of body:  Shoulder    Time: 1431-1501 PT Time Calculation (min) (ACUTE ONLY): 30 min   Charges:   PT Evaluation $PT Eval Low Complexity: 1 Low PT Treatments $Therapeutic Activity: 8-22 mins       Hendricks Limes, PT 09/07/21, 3:43 PM

## 2021-09-07 NOTE — Consult Note (Signed)
Reason for Consult: Left proximal humerus fracture Referring Physician: Dr. Merceda Elks Rinehart is an 75 y.o. male.  HPI: Patient is a 75 year old male who is admitted with confusion.  He suffered a fracture on 9/9 and was seen in the emergency room where x-rays taken in immobilizer ordered.  He was admitted on 911 secondary to increased confusion.  Immobilizer is not on the arm.  He is poor historian.  Past Medical History:  Diagnosis Date   Dementia (HCC)    Hypertension     Past Surgical History:  Procedure Laterality Date   NO PAST SURGERIES      Family History  Family history unknown: Yes    Social History:  reports that he has never smoked. He has never used smokeless tobacco. He reports that he does not currently use alcohol. He reports that he does not currently use drugs.  Allergies: No Known Allergies  Medications: I have reviewed the patient's current medications.  Results for orders placed or performed during the hospital encounter of 09/06/21 (from the past 48 hour(s))  Comprehensive metabolic panel     Status: Abnormal   Collection Time: 09/06/21 12:24 PM  Result Value Ref Range   Sodium 143 135 - 145 mmol/L   Potassium 4.1 3.5 - 5.1 mmol/L    Comment: HEMOLYSIS AT THIS LEVEL MAY AFFECT RESULT   Chloride 111 98 - 111 mmol/L   CO2 20 (L) 22 - 32 mmol/L   Glucose, Bld 112 (H) 70 - 99 mg/dL    Comment: Glucose reference range applies only to samples taken after fasting for at least 8 hours.   BUN 22 8 - 23 mg/dL   Creatinine, Ser 4.09 0.61 - 1.24 mg/dL   Calcium 8.7 (L) 8.9 - 10.3 mg/dL   Total Protein 6.3 (L) 6.5 - 8.1 g/dL   Albumin 3.4 (L) 3.5 - 5.0 g/dL   AST 25 15 - 41 U/L   ALT 17 0 - 44 U/L   Alkaline Phosphatase 67 38 - 126 U/L   Total Bilirubin 1.7 (H) 0.3 - 1.2 mg/dL   GFR, Estimated >81 >19 mL/min    Comment: (NOTE) Calculated using the CKD-EPI Creatinine Equation (2021)    Anion gap 12 5 - 15    Comment: Performed at [facility],  [address] [place], [place] [address]  CBC with Differential     Status: None (Preliminary result)   Collection Time: 09/06/21 12:24 PM  Result Value Ref Range   WBC  4.0 - 10.5 K/uL    SPECIMEN CLOTTED KATIE MARTINEZ  ON 09/06/21 [facility]   RBC  4.22 - 5.81 MIL/uL    SPECIMEN CLOTTED KATIE MARTINEZ  ON 09/06/21 SK[facility]   Hemoglobin  13.0 - 17.0 g/dL    SPECIMEN CLOTTED KATIE MARTINEZ  ON 09/06/21 [facility]   HCT  39.0 - 52.0 %    SPECIMEN CLOTTED KATIE MARTINEZ  ON 09/06/21 SK[facility]   MCV  80.0 - 100.0 fL    SPECIMEN CLOTTED KATIE MARTINEZ  ON 09/06/21 SKL   MCH  26.0 - 34.0 pg    SPECIMEN CLOTTED KATIE MARTINEZ  ON 09/06/21 SKL   MCHC  30.0 - 36.0 g/dL    SPECIMEN CLOTTED KATIE MARTINEZ  ON 09/06/21 SKL   RDW  11.5 - 15.5 %    SPECIMEN CLOTTED KATIE MARTINEZ  ON 09/06/21 SKL   Platelets  150 - 400 K/uL    SPECIMEN CLOTTED KATIE MARTINEZ  ON 09/06/21 SKL  nRBC  0.0 - 0.2 %    SPECIMEN CLOTTED KATIE MARTINEZ  ON 09/06/21 SK[facility]   Neutrophils Relative % PENDING %   Neutro Abs PENDING 1.7 - 7.7 K/uL   Band Neutrophils PENDING %   Lymphocytes Relative PENDING %   Lymphs Abs PENDING 0.7 - 4.0 K/uL   Monocytes Relative PENDING %   Monocytes Absolute PENDING 0.1 - 1.0 K/uL   Eosinophils Relative PENDING %   Eosinophils Absolute PENDING 0.0 - 0.5 K/uL   Basophils Relative PENDING %   Basophils Absolute PENDING 0.0 - 0.1 K/uL   WBC Morphology PENDING    RBC Morphology PENDING    Smear Review PENDING    Other PENDING %   nRBC PENDING 0 /100 WBC   Metamyelocytes Relative PENDING %   Myelocytes PENDING %   Promyelocytes Relative PENDING %   Blasts PENDING %   Immature Granulocytes PENDING %   Abs Immature Granulocytes PENDING 0.00 - 0.07 K/uL  Troponin I (High Sensitivity)     Status: None   Collection Time: 09/06/21 12:24 PM  Result Value Ref Range   Troponin I (High Sensitivity) 13 <18 ng/L    Comment: (NOTE) Elevated high sensitivity troponin I  (hsTnI) values and significant  changes across serial measurements may suggest ACS but many other  chronic and acute conditions are known to elevate hsTnI results.  Refer to the "Links" section for chest pain algorithms and additional  guidance. Performed at [facility], [address] [place], [place] [address]   Urinalysis, Complete w Microscopic Urine, Random     Status: Abnormal   Collection Time: 09/06/21 12:30 PM  Result Value Ref Range   Color, Urine YELLOW YELLOW   APPearance CLEAR CLEAR   Specific Gravity, Urine >1.030 (H) 1.005 - 1.030   pH 5.5 5.0 - 8.0   Glucose, UA NEGATIVE NEGATIVE mg/dL   Hgb urine dipstick TRACE (A) NEGATIVE   Bilirubin Urine SMALL (A) NEGATIVE   Ketones, ur 80 (A) NEGATIVE mg/dL   Protein, ur 30 (A) NEGATIVE mg/dL   Nitrite NEGATIVE NEGATIVE   Leukocytes,Ua TRACE (A) NEGATIVE   RBC / HPF 0-5 0 - 5 RBC/hpf   WBC, UA 21-50 0 - 5 WBC/hpf   Bacteria, UA RARE (A) NONE SEEN   Squamous Epithelial / LPF 0-5 0 - 5   Mucus PRESENT    Hyaline Casts, UA PRESENT     Comment: Performed at [facility], [address] [place], [place] [address]  Resp Panel by RT-PCR (Flu A&B, Covid) Nasopharyngeal Swab     Status: None   Collection Time: 09/06/21 12:44 PM   Specimen: Nasopharyngeal Swab; Nasopharyngeal(NP) swabs in vial transport medium  Result Value Ref Range   SARS Coronavirus 2 by RT PCR NEGATIVE NEGATIVE    Comment: (NOTE) SARS-CoV-2 target nucleic acids are NOT DETECTED.  The SARS-CoV-2 RNA is generally detectable in upper respiratory specimens during the acute phase of infection. The lowest concentration of SARS-CoV-2 viral copies this assay can detect is 138 copies/mL. A negative result does not preclude SARS-Cov-2 infection and should not be used as the sole basis for treatment or other patient management decisions. A negative result may occur with  improper specimen collection/handling, submission of specimen other than  nasopharyngeal swab, presence of viral mutation(s) within the areas targeted by this assay, and inadequate number of viral copies(<138 copies/mL). A negative result must be combined with clinical observations, patient history, and epidemiological information. The expected result is  Negative.  Fact Sheet for Patients:  BloggerCourse.com  Fact Sheet for Healthcare Providers:  SeriousBroker.it  This test is no t yet approved or cleared by the [place] FDA and  has been authorized for detection and/or diagnosis of SARS-CoV-2 by FDA under an Emergency Use Authorization (EUA). This EUA will remain  in effect (meaning this test can be used) for the duration of the COVID-19 declaration under Section 564(b)(1) of the Act, 21 U.S.C.section 360bbb-3(b)(1), unless the authorization is terminated  or revoked sooner.       Influenza A by PCR NEGATIVE NEGATIVE   Influenza B by PCR NEGATIVE NEGATIVE    Comment: (NOTE) The Xpert Xpress SARS-CoV-2/FLU/RSV plus assay is intended as an aid in the diagnosis of influenza from Nasopharyngeal swab specimens and should not be used as a sole basis for treatment. Nasal washings and aspirates are unacceptable for Xpert Xpress SARS-CoV-2/FLU/RSV testing.  Fact Sheet for Patients: BloggerCourse.com  Fact Sheet for Healthcare Providers: SeriousBroker.it  This test is not yet approved or cleared by the [place] FDA and has been authorized for detection and/or diagnosis of SARS-CoV-2 by FDA under an Emergency Use Authorization (EUA). This EUA will remain in effect (meaning this test can be used) for the duration of the COVID-19 declaration under Section 564(b)(1) of the Act, 21 U.S.C. section 360bbb-3(b)(1), unless the authorization is terminated or revoked.  Performed at [facility], [address] [place], [place] [address]   CBC  with Differential/Platelet     Status: Abnormal   Collection Time: 09/06/21  2:22 PM  Result Value Ref Range   WBC 8.7 4.0 - 10.5 K/uL   RBC 3.70 (L) 4.22 - 5.81 MIL/uL   Hemoglobin 12.3 (L) 13.0 - 17.0 g/dL   HCT 12.4 (L) 58.0 - 99.8 %   MCV 93.5 80.0 - 100.0 fL   MCH 33.2 26.0 - 34.0 pg   MCHC 35.5 30.0 - 36.0 g/dL   RDW 33.8 25.0 - 53.9 %   Platelets 155 150 - 400 K/uL   nRBC 0.0 0.0 - 0.2 %   Neutrophils Relative % 78 %   Neutro Abs 6.9 1.7 - 7.7 K/uL   Lymphocytes Relative 9 %   Lymphs Abs 0.8 0.7 - 4.0 K/uL   Monocytes Relative 10 %   Monocytes Absolute 0.9 0.1 - 1.0 K/uL   Eosinophils Relative 1 %   Eosinophils Absolute 0.1 0.0 - 0.5 K/uL   Basophils Relative 1 %   Basophils Absolute 0.0 0.0 - 0.1 K/uL   Immature Granulocytes 1 %   Abs Immature Granulocytes 0.04 0.00 - 0.07 K/uL    Comment: Performed at [facility], [address], [place], [place] [address]  Troponin I (High Sensitivity)     Status: None   Collection Time: 09/06/21  2:22 PM  Result Value Ref Range   Troponin I (High Sensitivity) 13 <18 ng/L    Comment: (NOTE) Elevated high sensitivity troponin I (hsTnI) values and significant  changes across serial measurements may suggest ACS but many other  chronic and acute conditions are known to elevate hsTnI results.  Refer to the "Links" section for chest pain algorithms and additional  guidance. Performed at [facility], [address] [place], [place] [address]   TSH     Status: None   Collection Time: 09/06/21  3:20 PM  Result Value Ref Range   TSH 0.801 0.350 - 4.500 uIU/mL    Comment: Performed by a 3rd Generation assay  with a functional sensitivity of <=0.01 uIU/mL. Performed at [facility], [address] [place], [place] [address]   Vitamin B12     Status: None   Collection Time: 09/06/21  3:20 PM  Result Value Ref Range   Vitamin B-12 286 180 - 914 pg/mL    Comment: (NOTE) This assay is not validated for  testing neonatal or myeloproliferative syndrome specimens for Vitamin B12 levels. Performed at [facility], [address] [place], [place] [address]   Magnesium     Status: None   Collection Time: 09/06/21  3:20 PM  Result Value Ref Range   Magnesium 2.0 1.7 - 2.4 mg/dL    Comment: Performed at [facility], [address] [place], [place] [address]  VITAMIN D 25 Hydroxy (Vit-D Deficiency, Fractures)     Status: Abnormal   Collection Time: 09/06/21  3:20 PM  Result Value Ref Range   Vit D, 25-Hydroxy 123.33 (H) 30 - 100 ng/mL    Comment: (NOTE) Vitamin D deficiency has been defined by the Institute of Medicine  and an Endocrine Society practice guideline as a level of serum 25-OH  vitamin D less than 20 ng/mL (1,2). The Endocrine Society went on to  further define vitamin D insufficiency as a level between 21 and 29  ng/mL (2).  1. IOM (Institute of Medicine). 2010. Dietary reference intakes for  calcium and D. [place]: The [employer]. 2. Holick MF, Binkley [place], Bischoff-Ferrari HA, et al. Evaluation,  treatment, and prevention of vitamin D deficiency: an Endocrine  Society clinical practice guideline, JCEM. 2011 Jul; 96(7): 1911-30.  Performed at [facility], [address], [place], [place] [address]   Procalcitonin - Baseline     Status: None   Collection Time: 09/06/21  3:20 PM  Result Value Ref Range   Procalcitonin <0.10 ng/mL    Comment:        Interpretation: PCT (Procalcitonin) <= 0.5 ng/mL: Systemic infection (sepsis) is not likely. Local bacterial infection is possible. (NOTE)       Sepsis PCT Algorithm           Lower Respiratory Tract                                      Infection PCT Algorithm    ----------------------------     ----------------------------         PCT < 0.25 ng/mL                PCT < 0.10 ng/mL          Strongly encourage             Strongly discourage   discontinuation of antibiotics     initiation of antibiotics    ----------------------------     -----------------------------       PCT 0.25 - 0.50 ng/mL            PCT 0.10 - 0.25 ng/mL               OR       >80% decrease in PCT            Discourage initiation of  antibiotics      Encourage discontinuation           of antibiotics    ----------------------------     -----------------------------         PCT >= 0.50 ng/mL              PCT 0.26 - 0.50 ng/mL               AND        <80% decrease in PCT             Encourage initiation of                                             antibiotics       Encourage continuation           of antibiotics    ----------------------------     -----------------------------        PCT >= 0.50 ng/mL                  PCT > 0.50 ng/mL               AND         increase in PCT                  Strongly encourage                                      initiation of antibiotics    Strongly encourage escalation           of antibiotics                                     -----------------------------                                           PCT <= 0.25 ng/mL                                                 OR                                        > 80% decrease in PCT                                      Discontinue / Do not initiate                                             antibiotics  Performed at [facility], [address], [place], [place] [address]   Ammonia     Status: None   Collection Time: 09/06/21  5:48 PM  Result Value Ref Range   Ammonia 13 9 - 35 umol/L    Comment: Performed at [facility], [address] [place], [place] [address]  Lactic acid, plasma     Status: None   Collection Time: 09/06/21  5:48 PM  Result Value Ref Range   Lactic Acid, Venous 1.0 0.5 - 1.9 mmol/L    Comment: Performed at [facility], [address] [place], [place] [address]  D-dimer, quantitative     Status:  Abnormal   Collection Time: 09/06/21  5:48 PM  Result Value Ref Range   D-Dimer, Quant 6.42 (H) 0.00 - 0.50 ug/mL-FEU    Comment: (NOTE) At the manufacturer cut-off value of 0.5 g/mL FEU, this assay has a negative predictive value of 95-100%.This assay is intended for use in conjunction with a clinical pretest probability (PTP) assessment model to exclude pulmonary embolism (PE) and deep venous thrombosis (DVT) in outpatients suspected of PE or DVT. Results should be correlated with clinical presentation. Performed at [facility], [address] [place], [place] [address]   CK     Status: None   Collection Time: 09/06/21  5:48 PM  Result Value Ref Range   Total CK 197 49 - 397 U/L    Comment: Performed at [facility], [address] [place], [place] [address]  Lactic acid, plasma     Status: None   Collection Time: 09/06/21  8:59 PM  Result Value Ref Range   Lactic Acid, Venous 1.0 0.5 - 1.9 mmol/L    Comment: Performed at [facility], [address] [place], [place] [address]  Basic metabolic panel     Status: Abnormal   Collection Time: 09/07/21  4:37 AM  Result Value Ref Range   Sodium 145 135 - 145 mmol/L   Potassium 3.7 3.5 - 5.1 mmol/L   Chloride 115 (H) 98 - 111 mmol/L   CO2 24 22 - 32 mmol/L   Glucose, Bld 97 70 - 99 mg/dL    Comment: Glucose reference range applies only to samples taken after fasting for at least 8 hours.   BUN 24 (H) 8 - 23 mg/dL   Creatinine, Ser 1.020.82 0.61 - 1.24 mg/dL   Calcium 8.3 (L) 8.9 - 10.3 mg/dL   GFR, Estimated >72>60 >53>60 mL/min    Comment: (NOTE) Calculated using the CKD-EPI Creatinine Equation (2021)    Anion gap 6 5 - 15    Comment: Performed at [facility], [address] [place], [place] [address]  CBC     Status: Abnormal   Collection Time: 09/07/21  4:37 AM  Result Value Ref Range   WBC 6.9 4.0 - 10.5 K/uL   RBC 3.45 (L) 4.22 - 5.81 MIL/uL   Hemoglobin 11.7 (L) 13.0 - 17.0 g/dL    HCT 34.732.8 (L) 42.539.0 - 52.0 %   MCV 95.1 80.0 - 100.0 fL   MCH 33.9 26.0 - 34.0 pg   MCHC 35.7 30.0 - 36.0 g/dL   RDW 95.612.8 38.711.5 - 56.415.5 %   Platelets 145 (L) 150 - 400 K/uL   nRBC 0.0 0.0 - 0.2 %    Comment: Performed at [facility], [address] [place], [place] [address]    CT HEAD WO CONTRAST (5MM)  Result Date: 09/06/2021 CLINICAL DATA:  Mental status change. EXAM: CT HEAD WITHOUT CONTRAST TECHNIQUE: Contiguous axial images were obtained from the base of the skull through the vertex without intravenous  contrast. COMPARISON:  September 04, 2021 FINDINGS: Brain: No evidence of acute infarction, hemorrhage, hydrocephalus, extra-axial collection or mass lesion/mass effect. Moderate brain parenchymal volume loss and deep white matter microangiopathy. Vascular: No hyperdense vessel or unexpected calcification. Skull: Normal. Negative for fracture or focal lesion. Sinuses/Orbits: No acute finding. Other: None. IMPRESSION: 1. No acute intracranial abnormality. 2. Moderate brain parenchymal atrophy and chronic microvascular disease. Electronically Signed   By: Ted Mcalpine M.D.   On: 09/06/2021 14:37   MR BRAIN WO CONTRAST  Result Date: 09/06/2021 CLINICAL DATA:  Neuro deficit, acute, stroke suspected EXAM: MRI HEAD WITHOUT CONTRAST TECHNIQUE: Multiplanar, multiecho pulse sequences of the brain and surrounding structures were obtained without intravenous contrast. COMPARISON:  Same day CT head. FINDINGS: Brain: No acute infarction, hemorrhage, hydrocephalus, extra-axial collection or mass lesion. Moderate atrophy with ex vacuo ventricular dilation. Moderate scattered T2 hyperintensities within the supratentorial and pontine white matter, nonspecific but compatible with chronic microvascular ischemic disease. Small foci of susceptibility artifact within the left cerebellum, likely the sequela of prior microhemorrhages. Vascular: Major arterial flow voids are maintained at the skull base.  Skull and upper cervical spine: Normal marrow signal. Sinuses/Orbits: Mild to moderate scattered paranasal sinus mucosal thickening. No acute orbital findings. Other: Trace right mastoid fluid. IMPRESSION: 1. No evidence of acute intracranial abnormality. 2. 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 3. Mild-to-moderate paranasal sinus mucosal thickening. Electronically Signed   By: Feliberto Harts M.D.   On: 09/06/2021 17:03   DG Chest Portable 1 View  Result Date: 09/06/2021 CLINICAL DATA:  Increased weakness over the past 2 days. Decreased oral intake. Clinical concern for pneumonia. EXAM: PORTABLE CHEST 1 VIEW COMPARISON:  04/08/2021 FINDINGS: The cardiac silhouette remains borderline enlarged. Stable mild elevation of the left hemidiaphragm. Clear lungs. Mild thoracic spine degenerative changes. IMPRESSION: No acute abnormality. Electronically Signed   By: Beckie Salts M.D.   On: 09/06/2021 13:17    Review of Systems Blood pressure 136/84, pulse 86, temperature 98.9 F (37.2 C), resp. rate 18, height 6' (1.829 m), weight 82.6 kg, SpO2 97 %. Physical Exam He has ecchymosis to the proximal arm on the left with pain with any motion.  He is able to flex extend his fingers and sensation is intact as well as he I can determine from his mental status.  Radiographs reviewed x-rays show surgical neck fracture tuberosity fracture minimally displaced Assessment/Plan: Left proximal humerus fracture with minimal displacement.  Expect this will be treated nonoperatively with shoulder immobilizer.  He will need therapy subsequently to regain motion and strength  Kennedy Bucker 09/07/2021, 8:03 AM

## 2021-09-07 NOTE — Progress Notes (Signed)
Patient has been very drowsy most of the shift. He arouses and is alert for brief moments. Was able to administer night time medication to patient by crushing them in applesauce. Patients vitals are stable. Will continue to monitor.

## 2021-09-07 NOTE — Progress Notes (Signed)
[facility]  - [place] [facility]   PATIENT NAME: Justin Kim    MR#:  [id number]  DATE OF BIRTH:  06-Nov-1946  SUBJECTIVE:   patient came in from [facility] assisted living with altered mental status. Her earlier hip patient was awake and was eating breakfast. Ate 60% of his meal. Answered some questions appropriately. Took his meds.  Patient apparently had a mechanical fall few days ago. Sustainer humerus head fracture. Patient was discharged back to [facility] assisted living with outpatient follow-up. REVIEW OF SYSTEMS:   Review of Systems  Constitutional:  Negative for chills, fever and weight loss.  HENT:  Negative for ear discharge, ear pain and nosebleeds.   Eyes:  Negative for blurred vision, pain and discharge.  Respiratory:  Negative for sputum production, shortness of breath, wheezing and stridor.   Cardiovascular:  Negative for chest pain, palpitations, orthopnea and PND.  Gastrointestinal:  Negative for abdominal pain, diarrhea, nausea and vomiting.  Genitourinary:  Negative for frequency and urgency.  Musculoskeletal:  Positive for joint pain. Negative for back pain.  Neurological:  Positive for weakness. Negative for sensory change, speech change and focal weakness.  Psychiatric/Behavioral:  Positive for memory loss. Negative for depression and hallucinations. The patient is not nervous/anxious.   Tolerating Diet:yes Tolerating PT: pending  DRUG ALLERGIES:  No Known Allergies  VITALS:  Blood pressure 127/87, pulse 95, temperature 98.9 F (37.2 C), resp. rate 18, height 6' (1.829 m), weight 82.6 kg, SpO2 96 %.  PHYSICAL EXAMINATION:   Physical Exam  GENERAL:  75 y.o.-year-old patient lying in the bed with no acute distress. obese LUNGS: Normal breath sounds bilaterally, no wheezing, rales, rhonchi. No use of accessory muscles of respiration.  CARDIOVASCULAR: S1, S2 normal. No murmurs, rubs, or gallops.  ABDOMEN: Soft, nontender,  nondistended. Bowel sounds present. No organomegaly or mass.  EXTREMITIES: left arm bruise + NEUROLOGIC: non focal PSYCHIATRIC:  patient is alert and awake SKIN: No obvious rash, lesion, or ulcer.    LABORATORY PANEL:  CBC Recent Labs  Lab 09/07/21 0437  WBC 6.9  HGB 11.7*  HCT 32.8*  PLT 145*    Chemistries  Recent Labs  Lab 09/06/21 1224 09/06/21 1520 09/07/21 0437  NA 143  --  145  K 4.1  --  3.7  CL 111  --  115*  CO2 20*  --  24  GLUCOSE 112*  --  97  BUN 22  --  24*  CREATININE 0.82  --  0.82  CALCIUM 8.7*  --  8.3*  MG  --  2.0  --   AST 25  --   --   ALT 17  --   --   ALKPHOS 67  --   --   BILITOT 1.7*  --   --    Cardiac Enzymes No results for input(s): TROPONINI in the last 168 hours. RADIOLOGY:  CT HEAD WO CONTRAST ( )  Result Date: 09/06/2021 CLINICAL DATA:  Mental status change. EXAM: CT HEAD WITHOUT CONTRAST TECHNIQUE: Contiguous axial images were obtained from the base of the skull through the vertex without intravenous contrast. COMPARISON:  September 04, 2021 FINDINGS: Brain: No evidence of acute infarction, hemorrhage, hydrocephalus, extra-axial collection or mass lesion/mass effect. Moderate brain parenchymal volume loss and deep white matter microangiopathy. Vascular: No hyperdense vessel or unexpected calcification. Skull: Normal. Negative for fracture or focal lesion. Sinuses/Orbits: No acute finding. Other: None. IMPRESSION: 1. No acute intracranial abnormality. 2. Moderate brain parenchymal atrophy and  chronic microvascular disease. Electronically Signed   By: Justin Kim M.D.   On: 09/06/2021 14:37   MR BRAIN WO CONTRAST  Result Date: 09/06/2021 CLINICAL DATA:  Neuro deficit, acute, stroke suspected EXAM: MRI HEAD WITHOUT CONTRAST TECHNIQUE: Multiplanar, multiecho pulse sequences of the brain and surrounding structures were obtained without intravenous contrast. COMPARISON:  Same day CT head. FINDINGS: Brain: No acute infarction,  hemorrhage, hydrocephalus, extra-axial collection or mass lesion. Moderate atrophy with ex vacuo ventricular dilation. Moderate scattered T2 hyperintensities within the supratentorial and pontine white matter, nonspecific but compatible with chronic microvascular ischemic disease. Small foci of susceptibility artifact within the left cerebellum, likely the sequela of prior microhemorrhages. Vascular: Major arterial flow voids are maintained at the skull base. Skull and upper cervical spine: Normal marrow signal. Sinuses/Orbits: Mild to moderate scattered paranasal sinus mucosal thickening. No acute orbital findings. Other: Trace right mastoid fluid. IMPRESSION: 1. No evidence of acute intracranial abnormality. 2. 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 3. Mild-to-moderate paranasal sinus mucosal thickening. Electronically Signed   By: Justin Kim M.D.   On: 09/06/2021 17:03   DG Chest Portable 1 View  Result Date: 09/06/2021 CLINICAL DATA:  Increased weakness over the past 2 days. Decreased oral intake. Clinical concern for pneumonia. EXAM: PORTABLE CHEST 1 VIEW COMPARISON:  04/08/2021 FINDINGS: The cardiac silhouette remains borderline enlarged. Stable mild elevation of the left hemidiaphragm. Clear lungs. Mild thoracic spine degenerative changes. IMPRESSION: No acute abnormality. Electronically Signed   By: Justin Kim M.D.   On: 09/06/2021 13:17   ASSESSMENT AND PLAN:   Justin Kim is a 75 y.o. male with medical history significant for Lewy body dementia, Parkinson's disease, generalized weakness, debility, atrial fibrillation, not on anticoagulation due to increased risk of a fall, hypertension, who presents ER from facility for chief concerns of confusion and altered mental status.  Acute comminuted mildly displaced and impacted fracture involving the left proximal humerus surgical neck and greater tuberosity - Fall on 09/05/2019 - patient will be immobilized with a  sling/shoulder immobilizer  - Orthopedic surgeon has been consulted, Justin Kim recs non-operative mnx  Altered mental status /Acute Metabolic encephalopathy --MRI brain negative --CT head head --awake and alert--answers most questions appropriately --empiric IV Rocephin x 3 days for abnormal Urine  Parkinson's disease --carbidopa-levodopa 25-100 mg p.o. 3 times daily - Rivastigmine   Depression/anxiety -- escitalopram, mirtazapine    Atrial fibrillation chronic - resume home diltiazem - Patient currently not on oral anticoagulation due to risk of bleeding     PT, OT to see pt   Family communication :sister Jan Consults :ortho CODE STATUS: DNR per sister Jan DVT Prophylaxis :heparin Level of care: Med-Surg Status is: IP   Dispo: The patient is from: ALF              Anticipated d/c is to:  tbd              Patient currently is not medically stable to d/c.   Difficult to place patient No        TOTAL TIME TAKING CARE OF THIS PATIENT: 25 minutes.  >50% time spent on counselling and coordination of care  Note: This dictation was prepared with Dragon dictation along with smaller phrase technology. Any transcriptional errors that result from this process are unintentional.  Enedina Finner M.D    [facility]   CC: Primary care physician; Pcp, No Patient ID: Justin Kim, male   DOB: Feb 15, 1946, 75 y.o.   MRN: [id number]

## 2021-09-07 NOTE — Evaluation (Signed)
Occupational Therapy Evaluation Patient Details Name: Justin Kim MRN: [id number] DOB: 01/06/1946 Today's Date: 09/07/2021   History of Present Illness Pt is a 75 y.o. male presenting to ED 9/12 with AMS and weakness.  Recent ED visit d/t mechanical fall 9/9 with acute comminuted mildly displaced and impacted fx involving L proximal humerus surgical neck and greater tuberosity.  Per chart, pt also with possible acute fx's L superior and inferior pubic rami.  Pt now admitted with AMS, Parkinson's disease, a-fib, and ecchymosis of L UE.  PMH includes Lewy body dementia, htn, UTI, Parkinson's disease, a-fib, and COVID-19.   Clinical Impression   Pt seen for OT evaluation this date. Upon arrival to room, pt seated EOB finishing PT session. Prior to admission, pt was residing at an ALF, receiving SUPERVISION for functional mobility with RW. Pt was receiving occasional assist with ADLs, but pt had difficutly expanding on this information during eval. Pt's sister reported that pt completed grooming tasks with supervision, however was unsure on level of assist required for bathing or dressing. Pt's sister reporting that pt had difficulty feeding himself following fall.  Pt currently presents with LUE pain, decreased balance, decreased strength, and decreased activity tolerance. Due to these functional impairments, pt requires MAX A for UB bathing, MAX A for sling management, MIN-MAX A for seated grooming tasks, and MAX A+2 for sit<>supine transfers. Further mobility deferred d/t pt's HR fluctuating between 114-149 bpm sitting edge of bed. Pt would benefit from additional skilled OT services to maximize return to PLOF and minimize risk of future falls, injury, caregiver burden, and readmission. Upon discharge, recommend SNF.      Recommendations for follow up therapy are one component of a multi-disciplinary discharge planning process, led by the attending physician.  Recommendations may be updated based on  patient status, additional functional criteria and insurance authorization.   Follow Up Recommendations  SNF    Equipment Recommendations  Other (comment) (defer to next venue of care)       Precautions / Restrictions Precautions Precautions: Fall Precaution Comments: Aspiration Required Braces or Orthoses: Sling Restrictions Weight Bearing Restrictions: Yes LUE Weight Bearing: Non weight bearing Other Position/Activity Restrictions: L shoulder immobilizer at all times      Mobility Bed Mobility Overal bed mobility: Needs Assistance Bed Mobility: Sit to Supine     Supine to sit: Max assist;HOB elevated Sit to supine: Max assist;+2 for safety/equipment   General bed mobility comments: MAX A for trunk & LE support, +2 to scoot towards [facility]    Transfers                 General transfer comment: deferred d/t pt's HR fluctuating between 114-149 bpm sitting edge of bed    Balance Overall balance assessment: Needs assistance Sitting-balance support: Feet supported;Single extremity supported Sitting balance-Leahy Scale: Fair Sitting balance - Comments: steady sitting with intermittent R UE support; pt reporting feeling fatigued after attempting x2 seated grooming tasks                                   ADL either performed or assessed with clinical judgement   ADL Overall ADL's : Needs assistance/impaired     Grooming: Oral care;Minimal assistance;Wash/dry face;Supervision/safety;Set up;Applying deodorant;Sitting Grooming Details (indicate cue type and reason): Pt requires increased assist for bimanual grooming tasks; MIN A to open toothpaste container and MAX A to apply deoderant sitting EOB Upper Body Bathing: Maximal  assistance;Sitting           Lower Body Dressing: Maximal assistance;Bed level Lower Body Dressing Details (indicate cue type and reason): to don/doff socks                     Vision Baseline Vision/History: 1 Wears  glasses              Pertinent Vitals/Pain Pain Assessment: Faces Faces Pain Scale: Hurts little more Pain Location: L shoulder Pain Descriptors / Indicators: Sore;Tender Pain Intervention(s): Limited activity within patient's tolerance;Monitored during session;Repositioned        Extremity/Trunk Assessment Upper Extremity Assessment Upper Extremity Assessment: Generalized weakness;RUE deficits/detail;LUE deficits/detail RUE Deficits / Details: grossly 3/5 in all movements LUE: Unable to fully assess due to pain;Unable to fully assess due to immobilization LUE Sensation: decreased light touch   Lower Extremity Assessment Lower Extremity Assessment: Defer to PT evaluation   Cervical / Trunk Assessment Cervical / Trunk Assessment: Other exceptions Cervical / Trunk Exceptions: pt's head position turned to L (pt able to come to midline and a little past towards R with AROM and cueing)   Communication Communication Communication:  (pt soft spoken)   Cognition Arousal/Alertness: Awake/alert Behavior During Therapy: Flat affect Overall Cognitive Status: History of cognitive impairments - at baseline Area of Impairment: Orientation                 Orientation Level: Disoriented to;Place             General Comments: Pt alert and oriented to self & some aspects of situation. Difficult to obtain PLOD and assess cognition d/t pt soft spoken and minimally answering open ended question   General Comments  bruising L UE       Shoulder Instructions Shoulder Instructions Method for sponge bathing under operated UE: Maximal assistance Donning/doffing sling/immobilizer: Maximal assistance Correct positioning of sling/immobilizer: Maximal assistance    Home Living Family/patient expects to be discharged to:: Skilled nursing facility                                 Additional Comments: Lives at [facility] ALF      Prior Functioning/Environment Level of  Independence: Needs assistance  Gait / Transfers Assistance Needed: Pt reporting walking with assist vs using w/c (pt with difficulty expanding on this information).  Pt's sister arrived and reported pt was recently walking with walker (pt was walking to dining room with walker when he recently fell and broke his shoulder).  Pt typically walks with someone close to him (but not holding onto him) and using RW.  Pt has been having PT 3x/week for about the last 6 weeks (working on balance and walking but if pt was exhausted they would use w/c if needed). ADL's / Homemaking Assistance Needed: Pt reporting receiving occasional assist with ADLs, but again difficutly expanding on this information. Pt's sister reporting that pt able to complete grooming tasks with supervision, however unsure on level of assist required for bathing or dressing. Pt's sister reporting that pt had difficulty feeding himself following fall   Comments: Pt reporting h/o neck issues; pt's sister reporting she noticed on Friday--a few days ago--that pt was looking to L/turning head to L in bed (was not favoring this position prior).        OT Problem List: Decreased strength;Decreased range of motion;Decreased activity tolerance;Impaired balance (sitting and/or standing);Impaired UE functional use;Pain  OT Treatment/Interventions: Self-care/ADL training;Therapeutic exercise;Energy conservation;DME and/or AE instruction;Therapeutic activities;Patient/family education;Balance training    OT Goals(Current goals can be found in the care plan section) Acute Rehab OT Goals Patient Stated Goal: to have less pain OT Goal Formulation: With patient Time For Goal Achievement: 09/21/21 Potential to Achieve Goals: Fair ADL Goals Pt Will Perform Grooming: with min guard assist;sitting Pt Will Perform Upper Body Dressing: with min guard assist;sitting Pt Will Transfer to Toilet: with min assist;stand pivot transfer;bedside commode  OT  Frequency: Min 1X/week    AM-PAC OT "6 Clicks" Daily Activity     Outcome Measure Help from another person eating meals?: A Little Help from another person taking care of personal grooming?: A Little Help from another person toileting, which includes using toliet, bedpan, or urinal?: A Lot Help from another person bathing (including washing, rinsing, drying)?: A Lot Help from another person to put on and taking off regular upper body clothing?: A Lot Help from another person to put on and taking off regular lower body clothing?: A Lot 6 Click Score: 14   End of Session Nurse Communication: Mobility status  Activity Tolerance: Patient tolerated treatment well Patient left: in bed;with call bell/phone within reach;with bed alarm set;with family/visitor present  OT Visit Diagnosis: Unsteadiness on feet (R26.81);History of falling (Z91.81);Pain Pain - Right/Left: Left Pain - part of body: Arm                Time: 2263-3354 OT Time Calculation (min): 28 min Charges:  OT General Charges $OT Visit: 1 Visit OT Evaluation $OT Eval Moderate Complexity: 1 Mod OT Treatments $Self Care/Home Management : 8-22 mins  Matthew Folks, OTR/L [facility] [phone]

## 2021-09-07 NOTE — TOC Progression Note (Deleted)
Transition of Care [facility]) - Progression Note    Patient Details  Name: Justin Kim MRN: [id number] Date of Birth: 06/20/1946  Transition of [facility] CM/SW Contact  Margarito Liner, LCSW Phone Number: 09/07/2021, 3:04 PM  Clinical Narrative:  CSW called patient's sister, introduced role, and explained that discharge planning would be discussed. Patient is from [facility] ALF but she doesn't feel like he is getting the care he needs. She is interested in him going to [facility] for rehab and then transitioning to LTC. She has already spoken to the admissions coordinator. Patient has traditional Medicare. Per MD and UR, will switch to inpatient today. Explained Medicare waiver to sister. Valid until 10/13. Will have to see if [facility] is accepting waiver. PT/OT evals pending. No further concerns. CSW encouraged patient's sister to contact CSW as needed. CSW will continue to follow patient for support and facilitate discharge once medically stable.  Expected Discharge Plan and Services                                                 Social Determinants of Health (SDOH) Interventions    Readmission Risk Interventions No flowsheet data found.

## 2021-09-07 NOTE — Progress Notes (Signed)
   09/07/21 0910  Clinical Encounter Type  Visited With Patient not available  Visit Type Initial;Spiritual support;Social support  Referral From Nurse;Chaplain  Consult/Referral To Chaplain   Chaplain responded to a page from other Chaplain that there was a request for prayer. Chaplain made several attempts and waited to visit with the PT, but PT was in deep sleep. Chaplain will try to follow up later.   Posey Boyer, M. Div.

## 2021-09-07 NOTE — Evaluation (Addendum)
Clinical/Bedside Swallow Evaluation Patient Details  Name: Justin Kim MRN: [id number] Date of Birth: 1946/03/01  Today's Date: 09/07/2021 Time: SLP Start Time (ACUTE ONLY): 1330 SLP Stop Time (ACUTE ONLY): 1430 SLP Time Calculation (min) (ACUTE ONLY): 60 min  Past Medical History:  Past Medical History:  Diagnosis Date   Dementia (HCC)    Hypertension    Past Surgical History:  Past Surgical History:  Procedure Laterality Date   NO PAST SURGERIES     HPI:  Pt is a 75 y.o. male presenting to ED 9/11 with AMS and weakness.  Recent ED visit d/t mechanical fall 9/9 with acute comminuted mildly displaced and impacted fx involving L proximal humerus surgical neck and greater tuberosity.  Per chart, pt also with possible acute fx's L superior and inferior pubic rami.  Pt's Sister stated she felt pt continued to decline post returning to the ALF including not eating well.  Pt now admitted with AMS, Parkinson's disease, a-fib, and ecchymosis of L UE.  PMH includes Lewy body Dementia, htn, UTI, Parkinson's disease on Sinemet, a-fib, and COVID-19.   CXR: "No acute abnormality".   Assessment / Plan / Recommendation Clinical Impression  Pt appears to present w/ primary pharyngeal phase dysphagia suspect in light of acute illness and weakness w/ declined Cognitive status; Baseline Parkinson's Dis w/ associated Dementia. This can impact overall awareness/timing of swallow and safety during po tasks which increases risk for aspiration, choking. Pt's risk for aspiration is present but can be reduced when following general aspiration precautions and using a modified diet consistency w/ Nectar liquids. He required min+ verbal/visual cues and support w/ feeding during po tasks.       Pt required much positioning support as he could not use his LUE, c/o Pain in the LUE/shoulder, leaned more onto that side, and exhibited a Head Tilt to his Left side -- pt stated this had been ongoing for "2-3 weeks since I fell  when I wasn't helped by my Nurse (at his ALF) and had to manage on my own". Will ask for PT for input about Head positioning support d/t the Left tilt.  Pt consumed several trials of ice chips, purees, soft solids, and Thin/Nectar liquids. Overt clinical s/s of aspiration noted w/ thin liquids; No immediate, overt clinical s/s of aspiration noted w/ remaining consistencies; no decline in vocal quality; no cough, and no decline in respiratory status during/post trials. Oral phase was adequate for bolus management and oral clearing of the boluses given. Mastication of soft solids appropriate. Pt attempted self-feeding but by [employer] but required Min-Mod support and guidance d/t Cognitive decline. Encourage pt to hold own Cup during drinking which improves safety of swallowing. OM Exam appeared [facility] w/ No unilateral weakness noted. Some confusion of OM tasks and oral care.         D/t pt's Baseline, declined Cognitive status w/ Parkinson's Dis. and risk for aspiration, recommend initiation of the dysphagia level 3(mech soft) w/ Nectar liquids via Cup; general aspiration precautions; reduce Distractions during meals and engage pt during po's at meal for self-feeding. Pills in Puree for safer swallowing. Support w/ feeding at meals as needed. MD/NSG updated. ST services can follow pt while admitted and at discharge for further education and trials to upgrade diet again. Largely suspect that pt's Dementia and Parkinson's Dis. impact could hamper upgrade of diet back to thin liquids during acute illness stage, weakness. Precautions posted in room. SLP Visit Diagnosis: Dysphagia, pharyngeal phase (R13.13)  Aspiration Risk  Risk for inadequate nutrition/hydration;Mild aspiration risk;Moderate aspiration risk    Diet Recommendation   Dysphagia level 3(mech soft) w/ Nectar liquids via Cup; general aspiration precautions; reduce Distractions during meals and engage pt during po's at meal for self-feeding. Support  w/ feeding at meals as needed. Let pt help to Hold own Cup when drinking. Possibly more finger foods in diet currently.  Medication Administration: Whole meds with puree (for safer swallowing)    Other  Recommendations Recommended Consults:  (dietician; possible Palliative Care for discussion of GOC) Oral Care Recommendations: Oral care BID;Oral care before and after PO;Staff/trained caregiver to provide oral care Other Recommendations: Order thickener from pharmacy;Prohibited food (jello, ice cream, thin soups);Remove water pitcher;Have oral suction available   Follow up Recommendations  (TBD)      Frequency and Duration min 3x week  2 weeks       Prognosis Prognosis for Safe Diet Advancement: Fair Barriers to Reach Goals: Cognitive deficits;Time post onset;Severity of deficits Barriers/Prognosis Comment: baseline Cognitive decline      Swallow Study   General Date of Onset: 09/06/21 HPI: Pt is a 75 y.o. male presenting to ED 9/11 with AMS and weakness.  Recent ED visit d/t mechanical fall 9/9 with acute comminuted mildly displaced and impacted fx involving L proximal humerus surgical neck and greater tuberosity.  Per chart, pt also with possible acute fx's L superior and inferior pubic rami.  Pt's Sister stated she felt pt continued to decline post returning to the ALF including not eating well.  Pt now admitted with AMS, Parkinson's disease, a-fib, and ecchymosis of L UE.  PMH includes Lewy body Dementia, htn, UTI, Parkinson's disease on Sinemet, a-fib, and COVID-19.   CXR: "No acute abnormality". Type of Study: Bedside Swallow Evaluation Previous Swallow Assessment: none Diet Prior to this Study: Regular;Thin liquids Temperature Spikes Noted: No (wbc 6.9) Respiratory Status: Room air History of Recent Intubation: No Behavior/Cognition: Alert;Cooperative;Pleasant mood;Confused;Distractible;Requires cueing Oral Cavity Assessment: Within Functional Limits Oral Care Completed by SLP:  Yes Oral Cavity - Dentition: Adequate natural dentition Vision: Functional for self-feeding Self-Feeding Abilities: Able to feed self;Needs assist;Needs set up (slow oral prep/self-feeding) Patient Positioning: Upright in bed (needed careful positioning d/t LUE) Baseline Vocal Quality: Breathy;Low vocal intensity (pt stated he has "always" talked like this) Volitional Cough: Strong (-Fair) Volitional Swallow: Able to elicit    Oral/Motor/Sensory Function Overall Oral Motor/Sensory Function: Within functional limits   Ice Chips Ice chips: Within functional limits Presentation: Spoon (fed; 3 trials)   Thin Liquid Thin Liquid: Impaired Presentation: Cup;Self Fed;Straw (3-4 trials each method) Oral Phase Impairments: Poor awareness of bolus Pharyngeal  Phase Impairments: Cough - Delayed;Throat Clearing - Delayed Other Comments: mild+    Nectar Thick Nectar Thick Liquid: Within functional limits Presentation: Self Fed;Straw (8 trials) Other Comments: min slow oral prep/self-feeding   Honey Thick Honey Thick Liquid: Not tested   Puree Puree: Within functional limits Presentation: Spoon (fed; 8 trials)   Solid     Solid: Within functional limits (adequate) Presentation: Spoon (fed; 6 trials)        Jerilynn Som, MS, CCC-SLP Speech Language Pathologist Rehab Services [phone] Cedrick Partain 09/07/2021,4:40 PM

## 2021-09-07 NOTE — Progress Notes (Signed)
Orthopedic Tech Progress Note Patient Details:  Justin Kim July 12, 1946 [id number]  RN called requesting a SHOULDER IMMOBILIZER, told her we don't come out for that but she could order one from "materials"   Patient ID: Justin Kim, male   DOB: 1946-07-01, 75 y.o.   MRN: [id number]  Justin Kim 09/07/2021, 11:21 AM

## 2021-09-07 NOTE — Progress Notes (Signed)
OT Cancellation Note  Patient Details Name: Justin Kim MRN: [id number] DOB: [birth year]   Cancelled Treatment:    Reason Eval/Treat Not Completed: Other (comment). Orders received and chart reviewed. Per discussion with RN and unit secretary, pt awaiting shoulder immobilizer to be delivered to room. OT to evaluate once immobilizer delivered. Thanks  Matthew Folks, OTR/L [facility] [phone]

## 2021-09-07 NOTE — Progress Notes (Signed)
Initial Nutrition Assessment  DOCUMENTATION CODES:   Not applicable  INTERVENTION:   Ensure Enlive po BID, each supplement provides 350 kcal and 20 grams of protein  Magic cup TID with meals, each supplement provides 290 kcal and 9 grams of protein  MVI po daily   Dysphagia 3 diet   Pt at high refeed risk; recommend monitor potassium, magnesium and phosphorus labs daily until stable  NUTRITION DIAGNOSIS:   Inadequate oral intake related to acute illness as evidenced by per patient/family report.  GOAL:   Patient will meet greater than or equal to 90% of their needs  MONITOR:   PO intake, Supplement acceptance, Labs, Weight trends, Skin, I & O's  REASON FOR ASSESSMENT:   Malnutrition Screening Tool    ASSESSMENT:   75 y/o male with h/o Parkinson's disease, Lewy body dementia, HTN, Afib, depression, anxiety, COVID 19 (03/2021) and recent fall with left proximal humerus fracture who is admitted with AMS and weakness.  RD working remotely.  Unable to speak with pt r/t dementia. Per chart review, family reports pt with poor appetite and oral intake pta. RD suspects pt with decreased appetite and oral intake at baseline r/t dementia. RD will add supplements and vitamins to help pt meet his estimated needs. RD will also liberalize pt's diet. Pt is likely at high refeed risk. There is no documented weight history in chart to determine if any significant recent weight changes. Pt does appear to be up ~12lbs from his last documented weight on 9/9; RD unsure if admit weight is correct. RD will obtain NFPE at follow up. Pt is at high risk for malnutrition.   Medications reviewed and include: heparin, remeron, ceftriaxone   Labs reviewed: K 3.7 wnl  NUTRITION - FOCUSED PHYSICAL EXAM: Unable to perform at this time   Diet Order:   Diet Order             Diet Heart Room service appropriate? Yes; Fluid consistency: Thin  Diet effective now                  EDUCATION  NEEDS:   No education needs have been identified at this time  Skin:  Skin Assessment: Reviewed RN Assessment (ecchymosis)  Last BM:  pta  Height:   Ht Readings from Last 1 Encounters:  09/07/21 6' (1.829 m)    Weight:   Wt Readings from Last 1 Encounters:  09/06/21 82.6 kg    Ideal Body Weight:  80.9 kg  BMI:  Body mass index is 24.68 kg/m.  Estimated Nutritional Needs:   Kcal:  2100-2400kcal/day  Protein:  105-120g/day  Fluid:  2.0-2.3L/day  Betsey Holiday MS, RD, LDN Please refer to [facility] for RD and/or RD on-call/weekend/after hours pager

## 2021-09-07 NOTE — Progress Notes (Signed)
   09/07/21 1335  Clinical Encounter Type  Visited With Patient  Visit Type Follow-up;Spiritual support;Social support  Referral From Chaplain  Consult/Referral To Chaplain  Stress Factors  Patient Stress Factors Major life changes   Chaplain Riely Oetken did a  follow up visit PT and offered emotional and spiritual support. Chaplain prayed with PT as requested. Chaplain told PT that chaplain services are available if needed in the future.

## 2021-09-07 NOTE — TOC Initial Note (Signed)
Transition of Care [facility]) - Initial/Assessment Note    Patient Details  Name: Justin Kim MRN: [id number] Date of Birth: May 26, 1946  [facility] CM/SW Contact:    Margarito Liner, LCSW Phone Number: 09/07/2021, 3:11 PM  Clinical Narrative:    CSW called patient's sister, introduced role, and explained that discharge planning would be discussed. Patient is from [facility] ALF but she doesn't feel like he is getting the care he needs. She is interested in him going to [facility] for rehab and then transitioning to LTC. She has already spoken to the admissions coordinator. Patient has traditional Medicare. Per MD and UR, will switch to inpatient today. Explained Medicare waiver to sister. Valid until 10/13. Will have to see if [facility] is accepting waiver. PT/OT evals pending. No further concerns. CSW encouraged patient's sister to contact CSW as needed. CSW will continue to follow patient for support and facilitate discharge once medically stable.          Expected Discharge Plan: Skilled Nursing Facility Barriers to Discharge: Continued Medical Work up   Patient Goals and CMS Choice        Expected Discharge Plan and Services Expected Discharge Plan: Skilled Nursing Facility     Post Acute Care Choice: Skilled Nursing Facility Living arrangements for the past 2 months: Assisted Living Facility                                      Prior Living Arrangements/Services Living arrangements for the past 2 months: Assisted Living Facility Lives with:: Facility Resident Patient language and need for interpreter reviewed:: Yes Do you feel safe going back to the place where you live?: No   [place] with care  Need for Family Participation in Patient Care: Yes (Comment) Care giver support system in place?: Yes (comment)   Criminal Activity/Legal Involvement Pertinent to Current Situation/Hospitalization: No - Comment as needed  Activities of Daily Living Home  Assistive Devices/Equipment: Wheelchair, Environmental consultant (specify type) ADL Screening (condition at time of admission) Patient's cognitive ability adequate to safely complete daily activities?: No Is the patient deaf or have difficulty hearing?: No Does the patient have difficulty seeing, even when wearing glasses/contacts?: No Does the patient have difficulty concentrating, remembering, or making decisions?: Yes Patient able to express need for assistance with ADLs?: Yes Does the patient have difficulty dressing or bathing?: Yes Independently performs ADLs?: No Communication: Independent Dressing (OT): Needs assistance Is this a change from baseline?: Change from baseline, expected to last <3days Grooming: Needs assistance Is this a change from baseline?: Change from baseline, expected to last >3 days Feeding: Needs assistance Is this a change from baseline?: Change from baseline, expected to last >3 days Bathing: Needs assistance Is this a change from baseline?: Change from baseline, expected to last >3 days Toileting: Needs assistance Is this a change from baseline?: Change from baseline, expected to last >3days In/Out Bed: Needs assistance Is this a change from baseline?: Change from baseline, expected to last >3 days Walks in Home: Dependent Is this a change from baseline?: Change from baseline, expected to last >3 days Does the patient have difficulty walking or climbing stairs?: Yes Weakness of Legs: Both Weakness of Arms/Hands: Both  Permission Sought/Granted Permission sought to share information with : Facility Medical sales representative, Family Supports    Share Information with NAME: Jan Hemphill  Permission granted to share info w AGENCY: [facility]  Permission granted to share info w Relationship: Sister  Permission granted to share info w Contact Information: [phone]  Emotional Assessment Appearance:: Appears stated age Attitude/Demeanor/Rapport: Unable to  Assess Affect (typically observed): Unable to Assess Orientation: : Oriented to Self, Oriented to Place Alcohol / Substance Use: Not Applicable Psych Involvement: No (comment)  Admission diagnosis:  Altered mental status [R41.82] AMS (altered mental status) [R41.82] Patient Active Problem List   Diagnosis Date Noted   AMS (altered mental status) 09/07/2021   Closed fracture of left proximal humerus    Altered mental status 09/06/2021   Generalized weakness 04/08/2021   Acute lower UTI 04/08/2021   Dementia due to Parkinson's disease without behavioral disturbance (HCC) 04/08/2021   Parkinson disease (HCC) 04/08/2021   Unspecified atrial fibrillation (HCC) 04/08/2021   COVID-19 virus detected 04/08/2021   PCP:  Pcp, No Pharmacy:  No Pharmacies Listed    Social Determinants of Health (SDOH) Interventions    Readmission Risk Interventions No flowsheet data found.

## 2021-09-08 NOTE — TOC Progression Note (Addendum)
Transition of Care [facility]) - Progression Note    Patient Details  Name: Justin Kim MRN: [id number] Date of Birth: [birth year]  Transition [facility]) CM/SW Contact  Margarito Liner, LCSW Phone Number: 09/08/2021, 9:09 AM  Clinical Narrative: Per RN, patient is not AOx4 for her this morning. Left voicemail for [facility] admissions coordinator to let him know referral had been sent and to see if they accept Medicare waiver in case he does not stay 3 midnights.  9:34 am: Earliest [facility] can accept patient will be Thursday.  Expected Discharge Plan: Skilled Nursing Facility Barriers to Discharge: Continued Medical Work up  Expected Discharge Plan and Services Expected Discharge Plan: Skilled Nursing Facility     Post Acute Care Choice: Skilled Nursing Facility Living arrangements for the past 2 months: Assisted Living Facility                                       Social Determinants of Health (SDOH) Interventions    Readmission Risk Interventions No flowsheet data found.

## 2021-09-08 NOTE — Progress Notes (Signed)
[facility]  - [place] at [facility]   PATIENT NAME: Justin Kim    MR#:  [id number]  DATE OF BIRTH:  06/30/1946  SUBJECTIVE:   patient came in from [facility] assisted living with altered mental status. Her earlier hip patient was awake and was eating breakfast. Ate 60% of his meal. Answered some questions appropriately. Took his meds.  Patient apparently had a mechanical fall few days ago. Sustained humerus head fracture. Patient was discharged back to [facility] assisted living with outpatient follow-up.  Pt ate 50 % of his BF today REVIEW OF SYSTEMS:   ROS Tolerating Diet:yes Tolerating PT: pending  DRUG ALLERGIES:  No Known Allergies  VITALS:  Blood pressure 103/78, pulse (!) 56, temperature 98 F (36.7 C), resp. rate 18, height 6' (1.829 m), weight 82.6 kg, SpO2 98 %.  PHYSICAL EXAMINATION:   Physical Exam  GENERAL:  75 y.o.-year-old patient lying in the bed with no acute distress. obese LUNGS: Normal breath sounds bilaterally, no wheezing, rales, rhonchi. No use of accessory muscles of respiration.  CARDIOVASCULAR: S1, S2 normal. No murmurs, rubs, or gallops.  ABDOMEN: Soft, nontender, nondistended. Bowel sounds present. No organomegaly or mass.  EXTREMITIES: left arm bruise +, Sling+ NEUROLOGIC: non focal PSYCHIATRIC:  patient is alert and awake SKIN: No obvious rash, lesion, or ulcer.    LABORATORY PANEL:  CBC Recent Labs  Lab 09/07/21 0437  WBC 6.9  HGB 11.7*  HCT 32.8*  PLT 145*     Chemistries  Recent Labs  Lab 09/06/21 1224 09/06/21 1520 09/07/21 0437  NA 143  --  145  K 4.1  --  3.7  CL 111  --  115*  CO2 20*  --  24  GLUCOSE 112*  --  97  BUN 22  --  24*  CREATININE 0.82  --  0.82  CALCIUM 8.7*  --  8.3*  MG  --  2.0  --   AST 25  --   --   ALT 17  --   --   ALKPHOS 67  --   --   BILITOT 1.7*  --   --     Cardiac Enzymes No results for input(s): TROPONINI in the last 168 hours. RADIOLOGY:  CT HEAD WO  CONTRAST ( )  Result Date: 09/06/2021 CLINICAL DATA:  Mental status change. EXAM: CT HEAD WITHOUT CONTRAST TECHNIQUE: Contiguous axial images were obtained from the base of the skull through the vertex without intravenous contrast. COMPARISON:  September 04, 2021 FINDINGS: Brain: No evidence of acute infarction, hemorrhage, hydrocephalus, extra-axial collection or mass lesion/mass effect. Moderate brain parenchymal volume loss and deep white matter microangiopathy. Vascular: No hyperdense vessel or unexpected calcification. Skull: Normal. Negative for fracture or focal lesion. Sinuses/Orbits: No acute finding. Other: None. IMPRESSION: 1. No acute intracranial abnormality. 2. Moderate brain parenchymal atrophy and chronic microvascular disease. Electronically Signed   By: Ted Mcalpine M.D.   On: 09/06/2021 14:37   MR BRAIN WO CONTRAST  Result Date: 09/06/2021 CLINICAL DATA:  Neuro deficit, acute, stroke suspected EXAM: MRI HEAD WITHOUT CONTRAST TECHNIQUE: Multiplanar, multiecho pulse sequences of the brain and surrounding structures were obtained without intravenous contrast. COMPARISON:  Same day CT head. FINDINGS: Brain: No acute infarction, hemorrhage, hydrocephalus, extra-axial collection or mass lesion. Moderate atrophy with ex vacuo ventricular dilation. Moderate scattered T2 hyperintensities within the supratentorial and pontine white matter, nonspecific but compatible with chronic microvascular ischemic disease. Small foci of susceptibility artifact within the left cerebellum, likely  the sequela of prior microhemorrhages. Vascular: Major arterial flow voids are maintained at the skull base. Skull and upper cervical spine: Normal marrow signal. Sinuses/Orbits: Mild to moderate scattered paranasal sinus mucosal thickening. No acute orbital findings. Other: Trace right mastoid fluid. IMPRESSION: 1. No evidence of acute intracranial abnormality. 2. Moderate atrophy and chronic microvascular ischemic  disease. 3. Mild-to-moderate paranasal sinus mucosal thickening. Electronically Signed   By: Feliberto Harts M.D.   On: 09/06/2021 17:03   ASSESSMENT AND PLAN:   Justin Kim is a 75 y.o. male with medical history significant for Lewy body dementia, Parkinson's disease, generalized weakness, debility, atrial fibrillation, not on anticoagulation due to increased risk of a fall, hypertension, who presents ER from facility for chief concerns of confusion and altered mental status.  Acute comminuted mildly displaced and impacted fracture involving the left proximal humerus surgical neck and greater tuberosity - Fall on 09/05/2019 - patient will be immobilized with a sling/shoulder immobilizer  - Orthopedic surgeon has been consulted, Dr. Rosita Kea recs non-operative mnx  Altered mental status /Acute Metabolic encephalopathy --MRI brain negative --CT head head --awake and alert--answers most questions appropriately --empiric IV Rocephin x 3 days for abnormal Urine  Parkinson's disease --carbidopa-levodopa 25-100 mg p.o. 3 times daily - Rivastigmine   Depression/anxiety -- escitalopram, mirtazapine    Atrial fibrillation chronic - resume home diltiazem - Patient currently not on oral anticoagulation due to risk of bleeding     PT, OT  recommends rehab   Family communication :sister Justin Kim Consults :ortho CODE STATUS: DNR per sister Justin Kim DVT Prophylaxis :heparin Level of care: Med-Surg Status is: IP   Dispo: The patient is from: ALF              Anticipated d/c is to rehab              Patient currently currently is hemodynamically stable. Best optimized for discharge. Awaiting rehab bed.   Difficult to place patient No        TOTAL TIME TAKING CARE OF THIS PATIENT: 25 minutes.  >50% time spent on counselling and coordination of care  Note: This dictation was prepared with Dragon dictation along with smaller phrase technology. Any transcriptional errors that result from this  process are unintentional.  Enedina Finner M.D    [facility]   CC: Primary care physician; Pcp, No Patient ID: Justin Kim, male   DOB: 1946-10-04, 75 y.o.   MRN: [id number]

## 2021-09-08 NOTE — [place] FL2 (Signed)
[place] MEDICAID FL2 LEVEL OF CARE SCREENING TOOL     IDENTIFICATION  Patient Name: Justin Kim Birthdate: 1946-10-12 Sex: male Admission Date (Current Location): 09/06/2021  [facility] and [place] Number:  Chiropodist and Address:  [facility], [address] [place], [place] [address]      Provider Number: [id number]  Attending Physician Name and Address:  Enedina Finner, MD  Relative Name and Phone Number:       Current Level of Care: Hospital Recommended Level of Care: Skilled Nursing Facility Prior Approval Number:    Date Approved/Denied:   PASRR Number: [id number]  Discharge Plan: SNF    Current Diagnoses: Patient Active Problem List   Diagnosis Date Noted   AMS (altered mental status) 09/07/2021   Closed fracture of left proximal humerus    Altered mental status 09/06/2021   Generalized weakness 04/08/2021   Acute lower UTI 04/08/2021   Dementia due to Parkinson's disease without behavioral disturbance (HCC) 04/08/2021   Parkinson disease (HCC) 04/08/2021   Unspecified atrial fibrillation (HCC) 04/08/2021   COVID-19 virus detected 04/08/2021    Orientation RESPIRATION BLADDER Height & Weight     Self, Time, Situation, Place  Normal Incontinent, External catheter Weight: 182 lb (82.6 kg) Height:  6' (182.9 cm)  BEHAVIORAL SYMPTOMS/MOOD NEUROLOGICAL BOWEL NUTRITION STATUS   (None)  (Dementia, Parkinsons) Continent Diet (DYS 3. Fluid nectar thick. Extra Gravy on meats, potatoes. Likes puddings.)  AMBULATORY STATUS COMMUNICATION OF NEEDS Skin   Extensive Assist Verbally Bruising                       Personal Care Assistance Level of Assistance  Bathing, Feeding, Dressing Bathing Assistance: Maximum assistance Feeding assistance: Limited assistance Dressing Assistance: Maximum assistance     Functional Limitations Info  Sight, Hearing, Speech Sight Info: Adequate Hearing Info: Adequate Speech Info: Adequate     SPECIAL CARE FACTORS FREQUENCY  PT (By licensed PT), OT (By licensed OT), Speech therapy     PT Frequency: 5 x week OT Frequency: 5 x week     Speech Therapy Frequency: 5 x week      Contractures Contractures Info: Not present    Additional Factors Info  Code Status, Allergies Code Status Info: Full code Allergies Info: NKDA           Current Medications (09/08/2021):  This is the current hospital active medication list Current Facility-Administered Medications  Medication Dose Route Frequency Provider Last Rate Last Admin   acetaminophen (TYLENOL) tablet 650 mg  650 mg Oral Q6H PRN Cox, Amy N, DO       Or   acetaminophen (TYLENOL) suppository 650 mg  650 mg Rectal Q6H PRN Cox, Amy N, DO       carbidopa-levodopa (SINEMET IR) 25-100 MG per tablet immediate release 1 tablet  1 tablet Oral TID Cox, Amy N, DO   1 tablet at 09/07/21 2158   cefTRIAXone (ROCEPHIN) 1 g in sodium chloride 0.9 % 100 mL IVPB  1 g Intravenous Q24H Enedina Finner, MD   Stopped at 09/07/21 1248   diltiazem (CARDIZEM CD) 24 hr capsule 180 mg  180 mg Oral Daily Cox, Amy N, DO   180 mg at 09/07/21 1245   escitalopram (LEXAPRO) tablet 20 mg  20 mg Oral Daily Cox, Amy N, DO   20 mg at 09/07/21 1022   feeding supplement (ENSURE ENLIVE / ENSURE PLUS) liquid 237 mL  237 mL Oral TID BM  Enedina Finner, MD   237 mL at 09/07/21 1022   heparin injection 5,000 Units  5,000 Units Subcutaneous Q8H Cox, Amy N, DO   5,000 Units at 09/08/21 0533   HYDROcodone-acetaminophen (NORCO/VICODIN) 5-325 MG per tablet 1 tablet  1 tablet Oral Q6H PRN Enedina Finner, MD       metoprolol tartrate (LOPRESSOR) tablet 25 mg  25 mg Oral BID Enedina Finner, MD       mirtazapine (REMERON) tablet 30 mg  30 mg Oral QHS Cox, Amy N, DO   30 mg at 09/07/21 2158   multivitamin with minerals tablet 1 tablet  1 tablet Oral Daily Enedina Finner, MD       ondansetron [facility]) tablet 4 mg  4 mg Oral Q6H PRN Cox, Amy N, DO       Or   ondansetron (ZOFRAN) injection 4  mg  4 mg Intravenous Q6H PRN Cox, Amy N, DO       polyethylene glycol (MIRALAX / GLYCOLAX) packet 17 g  17 g Oral Daily PRN Cox, Amy N, DO       rivastigmine (EXELON) 9.5 mg/24hr 9.5 mg  9.5 mg Transdermal Daily Cox, Amy N, DO   9.5 mg at 09/07/21 1027   traMADol (ULTRAM) tablet 50 mg  50 mg Oral Q6H PRN Enedina Finner, MD         Discharge Medications: Please see discharge summary for a list of discharge medications.  Relevant Imaging Results:  Relevant Lab Results:   Additional Information SS#: [id number]. From [facility]. Sister interested in transition to LTC after rehab. Per chart review, has two COVID vaccines and two boosters. Dates unknown.  Margarito Liner, LCSW

## 2021-09-08 NOTE — Progress Notes (Signed)
Occupational Therapy Treatment Patient Details Name: Justin Kim MRN: [id number] DOB: 07-09-1946 Today's Date: 09/08/2021   History of present illness Pt is a 75 y.o. male presenting to ED 9/12 with AMS and weakness.  Recent ED visit d/t mechanical fall 9/9 with acute comminuted mildly displaced and impacted fx involving L proximal humerus surgical neck and greater tuberosity.  Per chart, pt also with possible acute fx's L superior and inferior pubic rami.  Pt now admitted with AMS, Parkinson's disease, a-fib, and ecchymosis of L UE.  PMH includes Lewy body dementia, htn, UTI, Parkinson's disease, a-fib, and COVID-19.   OT comments  Pt seen for OT treatment on this date. Upon arrival to room, pt awake and seated upright bed. Pt alert and oriented to self, place, and some aspects of situation (pt disoriented to date, stated year 2003). Pt currently requires MAX A for supine>sit transfers, MIN GUARD to wash/dry face sitting EOB and MAX A+2 for sit>supine transfers d/t pain, decreased strength, and decreased balance. Pt with poor sitting balance this date, requiring intermittent MIN A d/t R lateral lean and pt c/o worsening dizziness while sitting EOB. Of note, pt reporting that he felt similarly, "like I was about to pass out" prior to fall on 9/9. Further mobility deferred d/t worsening dizziness while sitting EOB. Plan to obtain orthostatic vitals in subsequent sessions. Pt left in bed, in no acute distress (BP 115/79 HR 94, SpO2 96%), and with all needs within reach. Will continue to follow POC. Discharge recommendation remains appropriate.     Recommendations for follow up therapy are one component of a multi-disciplinary discharge planning process, led by the attending physician.  Recommendations may be updated based on patient status, additional functional criteria and insurance authorization.    Follow Up Recommendations  SNF    Equipment Recommendations  Other (comment) (defer to next venue of  care)       Precautions / Restrictions Precautions Precautions: Fall Precaution Comments: Aspiration Required Braces or Orthoses: Sling Restrictions Weight Bearing Restrictions: Yes LUE Weight Bearing: Non weight bearing Other Position/Activity Restrictions: L shoulder immobilizer at all times       Mobility Bed Mobility Overal bed mobility: Needs Assistance Bed Mobility: Sit to Supine     Supine to sit: Max assist;HOB elevated Sit to supine: Max assist;+2 for safety/equipment   General bed mobility comments: +2 to scoot towards [facility]    Transfers                 General transfer comment: deferred d/t pt reporting worsening dizziness while sitting EOB    Balance Overall balance assessment: Needs assistance Sitting-balance support: Feet supported;Single extremity supported Sitting balance-Leahy Scale: Poor Sitting balance - Comments: Intermittent MIN A in setting of R lateral lean while sitting EOB                                   ADL either performed or assessed with clinical judgement   ADL Overall ADL's : Needs assistance/impaired     Grooming: Wash/dry face;Min guard;Sitting Grooming Details (indicate cue type and reason): Min guard d/t decreased siting balance at EOB                                      Cognition Arousal/Alertness: Awake/alert Behavior During Therapy: Flat affect Overall Cognitive Status: History of cognitive  impairments - at baseline                                 General Comments: Pt alert and oriented to self,place, and some aspects of situation. Pt disoriented to date (stated year 2003).              General Comments Pt c/o of worsening dizziness while sitting EOB and reporting feeling similarly prior to fall last week. Pt returned to supine (BP 115/79 HR 94, SpO2 96%). Plan to check orthostatic vitals at next session    Pertinent Vitals/ Pain       Pain Assessment: Faces Faces  Pain Scale: Hurts little more Pain Location: abdomen Pain Descriptors / Indicators: Sore;Tender Pain Intervention(s): Limited activity within patient's tolerance;Monitored during session;Repositioned         Frequency  Min 1X/week        Progress Toward Goals  OT Goals(current goals can now be found in the care plan section)  Progress towards OT goals: Progressing toward goals  Acute Rehab OT Goals Patient Stated Goal: to have less pain OT Goal Formulation: With patient Time For Goal Achievement: 09/21/21 Potential to Achieve Goals: Fair  Plan Discharge plan remains appropriate;Frequency remains appropriate       AM-PAC OT "6 Clicks" Daily Activity     Outcome Measure   Help from another person eating meals?: A Little Help from another person taking care of personal grooming?: A Little Help from another person toileting, which includes using toliet, bedpan, or urinal?: A Lot Help from another person bathing (including washing, rinsing, drying)?: A Lot Help from another person to put on and taking off regular upper body clothing?: A Lot Help from another person to put on and taking off regular lower body clothing?: A Lot 6 Click Score: 14    End of Session    OT Visit Diagnosis: Unsteadiness on feet (R26.81);History of falling (Z91.81);Pain Pain - Right/Left: Left Pain - part of body: Arm   Activity Tolerance Treatment limited secondary to medical complications (Comment)   Patient Left in bed;with call bell/phone within reach;with bed alarm set   Nurse Communication Mobility status;Other (comment) (dizziness while sitting EOB)        Time: 4696-2952 OT Time Calculation (min): 27 min  Charges: OT General Charges $OT Visit: 1 Visit OT Treatments $Self Care/Home Management : 8-22 mins $Therapeutic Activity: 8-22 mins  Matthew Folks, OTR/L [facility] [phone]

## 2021-09-09 ENCOUNTER — Encounter: Payer: Self-pay | Admitting: Internal Medicine

## 2021-09-09 LAB — SARS CORONAVIRUS 2 (TAT 6-24 HRS): SARS Coronavirus 2: NEGATIVE

## 2021-09-09 NOTE — Progress Notes (Signed)
Occupational Therapy Treatment Patient Details Name: Justin Kim MRN: [id number] DOB: November 15, 1946 Today's Date: 09/09/2021   History of present illness Pt is a 75 y.o. male presenting to ED 9/12 with AMS and weakness.  Recent ED visit d/t mechanical fall 9/9 with acute comminuted mildly displaced and impacted fx involving L proximal humerus surgical neck and greater tuberosity.  Per chart, pt also with possible acute fx's L superior and inferior pubic rami.  Pt now admitted with AMS, a-fib, and ecchymosis of L UE.  PMH includes Lewy body dementia, htn, UTI, Parkinson's disease, a-fib, and COVID-19.   OT comments  Pt seen for OT treatment this date to f/u re: safety with ADLs/ADL mobility. Pt requires MOD/MAX A +2 to come to EOB sitting from elevated HOB position. 2 standing trials attempted with no success with MAX A from significantly elevated EOB height. Pt appears primarily limited d/t fearful of falling and endorses this when asked. Pt also c/o dizziness with positional changes although BP readings this date were not indicative of orthostasis.  OT engages pt in dynamic sitting tasks both in EOB sitting and while lying in bed in high-fowler's position to improve abdominal strength for fall prevention with PT addressing core strength and OT addressing dynamic reaching. In addition, OT Educates pt and nursing student regarding correct positioning of sling and positionoing recommendations when pt is in bed to help reduce swelling. Pt left in bed with all needs met and in reach.   Recommendations for follow up therapy are one component of a multi-disciplinary discharge planning process, led by the attending physician.  Recommendations may be updated based on patient status, additional functional criteria and insurance authorization.    Follow Up Recommendations  SNF    Equipment Recommendations  Other (comment) (defer)    Recommendations for Other Services      Precautions / Restrictions  Precautions Precautions: Fall Precaution Comments: Aspiration Required Braces or Orthoses: Sling Restrictions Weight Bearing Restrictions: Yes LUE Weight Bearing: Non weight bearing Other Position/Activity Restrictions: L shoulder immobilizer at all times       Mobility Bed Mobility Overal bed mobility: Needs Assistance Bed Mobility: Supine to Sit;Sit to Supine     Supine to sit: Mod assist;Max assist;+2 for physical assistance;+2 for safety/equipment;HOB elevated Sit to supine: Max assist;+2 for physical assistance   General bed mobility comments: increased time, cues to sequence, cues and assist to initiate all movement including LEs toward EOB.    Transfers                 General transfer comment: attempted to stand x2 trials with MAX A +2 from significantly elevated EOB surface. Knee blocking bilaterally. Pt appears to be somewhat self-limiting 2/2 fearful of falling and pt does endorse this when asked. Pt encouraged to lean forward to gain momentum and while he will when cued, he immediately resorts to leaning backwards when attempts to stand initiated.    Balance Overall balance assessment: Needs assistance Sitting-balance support: Feet supported;Single extremity supported Sitting balance-Leahy Scale: Poor Sitting balance - Comments: intermittent MIN A/CGA for static sitting balance, primarily leaning backwards, small spurts of being able to sustain static sitting when cued to lean fwd and use abdominal muscles.     Standing balance-Leahy Scale: Zero Standing balance comment: unsuccessful with coming to stand with MAX A +2 from elevated surface.  ADL either performed or assessed with clinical judgement   ADL Overall ADL's : Needs assistance/impaired     Grooming: Wash/dry face;Sitting;Min guard;Minimal assistance Grooming Details (indicate cue type and reason): MIN A to CGA for sitting balance, and assist to initate task,  unsupported sitting                                     Vision Baseline Vision/History: 1 Wears glasses     Perception     Praxis      Cognition Arousal/Alertness: Awake/alert Behavior During Therapy: Flat affect Overall Cognitive Status: History of cognitive impairments - at baseline Area of Impairment: Orientation;Following commands;Problem solving                 Orientation Level: Disoriented to;Place;Time     Following Commands: Follows one step commands with increased time     Problem Solving: Decreased initiation;Requires verbal cues General Comments: pt oriented to self and situation, disorientd to time/place. Pt able to follow simple one step commands with extended processing time.        Exercises Other Exercises Other Exercises: OT engages pt seated therex with light resistance to squeeze rag in his hand x10 reps to reduce edema to L hand. In addition, OT and PT coordinate to engage pt in dynamic sitting tasks both in EOB sitting and while lying in bed in high-fowler's position to improve abdominal strength for fall prevention and to improve sitting/standing balance/tolerance with PT addressing core strength and OT addressing dynamic reaching as it pertains to safely gathering ADL items in seated position. In addition, OT Educates pt and nursing student regarding correct positioning of sling and positionoing recommendations when pt is in bed to help reduce swelling.   Shoulder Instructions       General Comments BP taken today with no signs of orthostatis, as follows: lying 123/77, sitting 130/74, sitting after therex (after 5 minutes) 131/74, lying after activity: 137/76.    Pertinent Vitals/ Pain       Pain Assessment: Faces Faces Pain Scale: Hurts even more Pain Location: wincing with gentle attempts to adjust sling into correct position in prep for mobilization Pain Descriptors / Indicators: Grimacing;Guarding Pain Intervention(s):  Limited activity within patient's tolerance;Monitored during session;Repositioned;RN gave pain meds during session  Home Living                                          Prior Functioning/Environment              Frequency  Min 1X/week        Progress Toward Goals  OT Goals(current goals can now be found in the care plan section)  Progress towards OT goals: Progressing toward goals  Acute Rehab OT Goals Patient Stated Goal: to have less pain OT Goal Formulation: With patient Time For Goal Achievement: 09/21/21 Potential to Achieve Goals: [place] Discharge plan remains appropriate;Frequency remains appropriate    Co-evaluation    PT/OT/SLP Co-Evaluation/Treatment: Yes Reason for Co-Treatment: Complexity of the patient's impairments (multi-system involvement);For patient/therapist safety;To address functional/ADL transfers PT goals addressed during session: Mobility/safety with mobility;Strengthening/ROM OT goals addressed during session: ADL's and self-care;Strengthening/ROM      AM-PAC OT "6 Clicks" Daily Activity     Outcome Measure   Help from another person eating meals?:  A Little Help from another person taking care of personal grooming?: A Little Help from another person toileting, which includes using toliet, bedpan, or urinal?: A Lot Help from another person bathing (including washing, rinsing, drying)?: A Lot Help from another person to put on and taking off regular upper body clothing?: A Lot Help from another person to put on and taking off regular lower body clothing?: A Lot 6 Click Score: 14    End of Session    OT Visit Diagnosis: Unsteadiness on feet (R26.81);History of falling (Z91.81);Pain Pain - Right/Left: Left Pain - part of body: Arm   Activity Tolerance Patient limited by pain;Other (comment) (llimited by dizziness with positional changes although BP was not indicative of orthostasis.)   Patient Left in bed;with call  bell/phone within reach;with bed alarm set   Nurse Communication Mobility status;Other (comment) (dizziness/pain)        Time: 1239-3594 OT Time Calculation (min): 26 min  Charges: OT General Charges $OT Visit: 1 Visit OT Treatments $Therapeutic Activity: 8-22 mins  Gerrianne Scale, [place], OTR/L ascom [phone] 09/09/21, 10:23 AM

## 2021-09-09 NOTE — TOC Progression Note (Addendum)
Transition of Care [facility]) - Progression Note    Patient Details  Name: Justin Kim MRN: [id number] Date of Birth: Apr 20, 1946  [facility] CM/SW Contact  Margarito Liner, LCSW Phone Number: 09/09/2021, 9:09 AM  Clinical Narrative:   Left voicemail for [facility] admissions coordinator to confirm they can accept him tomorrow. Will have MD order COVID test once confirmed.  10:43 am: [facility] confirmed they will be able to accept him tomorrow. He will go to room E14. Sent secure chat to MD requesting COVID test.   Expected Discharge Plan: Skilled Nursing Facility Barriers to Discharge: Continued Medical Work up  Expected Discharge Plan and Services Expected Discharge Plan: Skilled Nursing Facility     Post Acute Care Choice: Skilled Nursing Facility Living arrangements for the past 2 months: Assisted Living Facility                                       Social Determinants of Health (SDOH) Interventions    Readmission Risk Interventions No flowsheet data found.

## 2021-09-09 NOTE — Plan of Care (Signed)
  Problem: Clinical Measurements: Goal: Will remain free from infection Outcome: Progressing   Problem: Activity: Goal: Risk for activity intolerance will decrease Outcome: Not Progressing  Pt discharging to rehab for therapy

## 2021-09-09 NOTE — Progress Notes (Signed)
A. fib    Progress Note    Justin Kim  UXN:[id number] DOB: 1946-12-09  DOA: 09/06/2021 PCP: Pcp, No      Brief Narrative:    Medical records reviewed and are as summarized below:  Justin Kim is a 75 y.o. male with medical history significant for recent visit to the emergency room on 09/03/2021 for left humerus fracture following a fall, Lewy body dementia, Parkinson's disease, generalized weakness, atrial fibrillation (not on anticoagulation because of risk for falls), hypertension, who presented from the assisted living facility to the emergency room because of generalized weakness and confusion.  He was admitted to the hospital for acute metabolic encephalopathy.  He had abnormal urinalysis.  He was given IV Rocephin for 3 days.  Urine culture did not show any growth.  It is difficult to determine whether patient actually had a UTI or not.    Assessment/Plan:   Principal Problem:   Altered mental status Active Problems:   Generalized weakness   Acute lower UTI   Dementia due to Parkinson's disease without behavioral disturbance (HCC)   Parkinson disease (HCC)   Unspecified atrial fibrillation (HCC)   Closed fracture of left proximal humerus   AMS (altered mental status)   Nutrition Problem: Inadequate oral intake Etiology: acute illness  Signs/Symptoms: per patient/family report   Body mass index is 24.68 kg/m.   Generalized weakness/debility: PT and OT recommend discharge to SNF.  Awaiting placement.  Acute metabolic encephalopathy: Mental status has improved to baseline.  Abnormal urinalysis/pyuria: Completed 3 days of IV Rocephin.  Urine culture did not show any growth.  Unable to confirm whether patient actually had a UTI.  Acute comminuted mildly displaced and impacted fracture of left proximal humeral surgical neck and greater tuberosity: (Treatment he is immobilized in a sling.  Outpatient follow-up with Dr. Arvilla Market, orthopedic surgeon.  Parkinson's  disease, Lewy body dementia: Continue carbidopa/levodopa and rivastigmine.  Permanent atrial fibrillation: Continue Cardizem.  He is not on long-term anticoagulation because of increased risk for falls.    Diet Order             DIET DYS 3 Room service appropriate? Yes with Assist; Fluid consistency: Nectar Thick  Diet effective now                      Consultants: Orthopedic surgeon  Procedures: None    Medications:    carbidopa-levodopa  1 tablet Oral TID   diltiazem  180 mg Oral Daily   escitalopram  20 mg Oral Daily   feeding supplement  237 mL Oral TID BM   heparin  5,000 Units Subcutaneous Q8H   metoprolol tartrate  25 mg Oral BID   mirtazapine  30 mg Oral QHS   multivitamin with minerals  1 tablet Oral Daily   rivastigmine  9.5 mg Transdermal Daily   Continuous Infusions:   Anti-infectives (From admission, onward)    Start     Dose/Rate Route Frequency Ordered Stop   09/08/21 1000  cefTRIAXone (ROCEPHIN) 1 g in sodium chloride 0.9 % 100 mL IVPB  Status:  Discontinued        1 g 200 mL/hr over 30 Minutes Intravenous Every 24 hours 09/07/21 1017 09/07/21 1022   09/07/21 1100  cefTRIAXone (ROCEPHIN) 1 g in sodium chloride 0.9 % 100 mL IVPB        1 g 200 mL/hr over 30 Minutes Intravenous Every 24 hours 09/07/21 1022 09/08/21 1216   09/07/21 1000  cefTRIAXone (  ROCEPHIN) 2 g in sodium chloride 0.9 % 100 mL IVPB  Status:  Discontinued        2 g 200 mL/hr over 30 Minutes Intravenous Every 24 hours 09/06/21 1523 09/07/21 1017   09/06/21 1445  cefTRIAXone (ROCEPHIN) 1 g in sodium chloride 0.9 % 100 mL IVPB        1 g 200 mL/hr over 30 Minutes Intravenous  Once 09/06/21 1442 09/06/21 1600              Family Communication/Anticipated D/C date and plan/Code Status   DVT prophylaxis: heparin injection 5,000 Units Start: 09/06/21 2200 Place TED hose Start: 09/06/21 1506     Code Status: Full Code  Family Communication: None Disposition Plan:     Status is: Inpatient  Remains inpatient appropriate because:Unsafe d/c plan and Inpatient level of care appropriate due to severity of illness  Dispo: The patient is from: ALF              Anticipated d/c is to: SNF              Patient currently is medically stable to d/c.   Difficult to place patient No           Subjective:   Interval events noted.  He complains of pain in the left shoulder when he moves around.  He is confused and unable to provide adequate history.  Objective:    Vitals:   09/08/21 1619 09/08/21 2016 09/09/21 0800 09/09/21 1133  BP: 118/73 107/71 120/76 124/86  Pulse: 85 86 85 72  Resp: 18 18 17 17   Temp: 98.4 F (36.9 C) 99.4 F (37.4 C) (!) 97.5 F (36.4 C) 98.7 F (37.1 C)  TempSrc:   Oral Oral  SpO2: 97% 95% 96% 99%  Weight:      Height:       No data found.   Intake/Output Summary (Last 24 hours) at 09/09/2021 1254 Last data filed at 09/09/2021 1017 Gross per 24 hour  Intake 940 ml  Output 850 ml  Net 90 ml   Filed Weights   09/06/21 1232  Weight: 82.6 kg    Exam:  GEN: NAD SKIN: Warm and dry EYES: EOMI ENT: MMM CV: RRR PULM: CTA B ABD: soft, ND, NT, +BS CNS: AAO x 1 (person), non focal EXT: Left arm immobilized in a sling        Data Reviewed:   I have personally reviewed following labs and imaging studies:  Labs: Labs show the following:   Basic Metabolic Panel: Recent Labs  Lab 09/06/21 1224 09/06/21 1520 09/07/21 0437  NA 143  --  145  K 4.1  --  3.7  CL 111  --  115*  CO2 20*  --  24  GLUCOSE 112*  --  97  BUN 22  --  24*  CREATININE 0.82  --  0.82  CALCIUM 8.7*  --  8.3*  MG  --  2.0  --    GFR Estimated Creatinine Clearance: 84.1 mL/min (by C-G formula based on SCr of 0.82 mg/dL). Liver Function Tests: Recent Labs  Lab 09/06/21 1224  AST 25  ALT 17  ALKPHOS 67  BILITOT 1.7*  PROT 6.3*  ALBUMIN 3.4*   No results for input(s): LIPASE, AMYLASE in the last 168 hours. Recent  Labs  Lab 09/06/21 1748  AMMONIA 13   Coagulation profile No results for input(s): INR, PROTIME in the last 168 hours.  CBC: Recent Labs  Lab 09/06/21 1224 09/06/21 1422 09/07/21 0437  WBC SPECIMEN CLOTTED KATIE MARTINEZ @1327  ON 09/06/21 SKL 8.7 6.9  NEUTROABS PENDING 6.9  --   HGB SPECIMEN CLOTTED KATIE MARTINEZ @1327  ON 09/06/21 SKL 12.3* 11.7*  HCT SPECIMEN CLOTTED KATIE MARTINEZ @1327  ON 09/06/21 SKL 34.6* 32.8*  MCV SPECIMEN CLOTTED KATIE MARTINEZ @1327  ON 09/06/21 SKL 93.5 95.1  PLT SPECIMEN CLOTTED KATIE MARTINEZ @1327  ON 09/06/21 SKL 155 145*   Cardiac Enzymes: Recent Labs  Lab 09/06/21 1748  CKTOTAL 197   BNP (last 3 results) No results for input(s): PROBNP in the last 8760 hours. CBG: No results for input(s): GLUCAP in the last 168 hours. D-Dimer: Recent Labs    09/06/21 1748  DDIMER 6.42*   Hgb A1c: No results for input(s): HGBA1C in the last 72 hours. Lipid Profile: No results for input(s): CHOL, HDL, LDLCALC, TRIG, CHOLHDL, LDLDIRECT in the last 72 hours. Thyroid function studies: Recent Labs    09/06/21 1520  TSH 0.801   Anemia work up: Recent Labs    09/06/21 1520  VITAMINB12 286   Sepsis Labs: Recent Labs  Lab 09/06/21 1224 09/06/21 1422 09/06/21 1520 09/06/21 1748 09/06/21 2059 09/07/21 0437  PROCALCITON  --   --  <0.10  --   --   --   WBC SPECIMEN CLOTTED KATIE MARTINEZ @1327  ON 09/06/21 SKL 8.7  --   --   --  6.9  LATICACIDVEN  --   --   --  1.0 1.0  --     Microbiology Recent Results (from the past 240 hour(s))  Resp Panel by RT-PCR (Flu A&B, Covid) Nasopharyngeal Swab     Status: None   Collection Time: 09/06/21 12:44 PM   Specimen: Nasopharyngeal Swab; Nasopharyngeal(NP) swabs in vial transport medium  Result Value Ref Range Status   SARS Coronavirus 2 by RT PCR NEGATIVE NEGATIVE Final    Comment: (NOTE) SARS-CoV-2 target nucleic acids are NOT DETECTED.  The SARS-CoV-2 RNA is generally detectable in upper  respiratory specimens during the acute phase of infection. The lowest concentration of SARS-CoV-2 viral copies this assay can detect is 138 copies/mL. A negative result does not preclude SARS-Cov-2 infection and should not be used as the sole basis for treatment or other patient management decisions. A negative result may occur with  improper specimen collection/handling, submission of specimen other than nasopharyngeal swab, presence of viral mutation(s) within the areas targeted by this assay, and inadequate number of viral copies(<138 copies/mL). A negative result must be combined with clinical observations, patient history, and epidemiological information. The expected result is Negative.  Fact Sheet for Patients:  11/06/21  Fact Sheet for Healthcare Providers:  11/06/21  This test is no t yet approved or cleared by the 11/07/21 FDA and  has been authorized for detection and/or diagnosis of SARS-CoV-2 by FDA under an Emergency Use Authorization (EUA). This EUA will remain  in effect (meaning this test can be used) for the duration of the COVID-19 declaration under Section 564(b)(1) of the Act, 21 U.S.C.section 360bbb-3(b)(1), unless the authorization is terminated  or revoked sooner.       Influenza A by PCR NEGATIVE NEGATIVE Final   Influenza B by PCR NEGATIVE NEGATIVE Final    Comment: (NOTE) The Xpert Xpress SARS-CoV-2/FLU/RSV plus assay is intended as an aid in the diagnosis of influenza from Nasopharyngeal swab specimens and should not be used as a sole basis for treatment. Nasal washings and aspirates are unacceptable for Xpert Xpress SARS-CoV-2/FLU/RSV testing.  Fact Sheet for Patients: BloggerCourse.com  Fact Sheet for Healthcare Providers: SeriousBroker.it  This test is not yet approved or cleared by the [place] FDA and has been  authorized for detection and/or diagnosis of SARS-CoV-2 by FDA under an Emergency Use Authorization (EUA). This EUA will remain in effect (meaning this test can be used) for the duration of the COVID-19 declaration under Section 564(b)(1) of the Act, 21 U.S.C. section 360bbb-3(b)(1), unless the authorization is terminated or revoked.  Performed at [facility], [address], [place], [place] [address]   Urine Culture     Status: None   Collection Time: 09/06/21 12:44 PM   Specimen: Urine, Random  Result Value Ref Range Status   Specimen Description   Final    URINE, RANDOM Performed at [facility], [address] [place], [place] [address]    Special Requests   Final    NONE Performed at [facility], [address] [place], [place] [address]    Culture   Final    NO GROWTH Performed at [facility], [address], [place], [place] [address]    Report Status 09/07/2021 FINAL  Final    Procedures and diagnostic studies:  No results found.             LOS: 2 days   Elenora Hawbaker  [facility]   Pager on [email or web address]. If 7PM-7AM, please contact night-coverage at [email or web address]     09/09/2021, 12:54 PM

## 2021-09-09 NOTE — Progress Notes (Addendum)
Physical Therapy Treatment Patient Details Name: Justin Kim MRN: [id number] DOB: [birth year] Today's Date: 09/09/2021   History of Present Illness Pt is a 75 y.o. male presenting to ED 9/12 with AMS and weakness.  Recent ED visit d/t mechanical fall 9/9 with acute comminuted mildly displaced and impacted fx involving L proximal humerus surgical neck and greater tuberosity.  Per chart, pt also with possible acute fx's L superior and inferior pubic rami.  Pt now admitted with AMS, a-fib, and ecchymosis of L UE.  PMH includes Lewy body dementia, htn, UTI, Parkinson's disease, a-fib, and COVID-19.    PT Comments    Pt received in room with RN giving pt meds. Reports L shoulder pain at 2/10 NPS without mobility. Agreeable to PT/OT co-treat due to pt complexity and pt/ staff safety. Orthostatics taken throughout session with no evidence of orthostasis. See below for vitals. Pt remains stiff and rigid with difficulty initiating movement without mod physical assist to move LE's towards EOB. MaxA+2 overall at torso to sitting EOB due to LUE being NWB. Post lean noted throughout session requiring intermittent minA to CGA to maintain static sitting balance. Able to correct with PT/OT demo to shift weight anteriorly and hold briefly (~10-15 sec) prior to returning to post lean. PT/OT performance of core activation and LUE exercise (OT) to improve core activation and weight shift to maintain static sitting without UE support and external support from caregiver/staff. X2 STS trials with R HHA with B knee block and bed significantly elevated bed with poor clearance of buttocks from bed. Pt fearful and appears slightly self limiting due to hx of falls. Pt MaxA+2 to return to bed deferring further mobility due to failed attempts to stand to date. D/c recs remain appropriate to address impairments in bed mobility, strength, and OOB mobility to return to PLOF prior to transitioning back to ALF.  Vitals:  Supine: 123/77  mm Hg, HR: 98-115 BPM  Sitting: 130/74 mm Hg  Sitting post 5 min therex: 131/80 mm Hg Supine post treatment: 137/76 mm Hg, HR: 110-123 BPM    Recommendations for follow up therapy are one component of a multi-disciplinary discharge planning process, led by the attending physician.  Recommendations may be updated based on patient status, additional functional criteria and insurance authorization.  Follow Up Recommendations  SNF     Equipment Recommendations  Other (comment) (next venue of care)    Recommendations for Other Services       Precautions / Restrictions Precautions Precautions: Fall Precaution Comments: Aspiration Required Braces or Orthoses: Sling Restrictions Weight Bearing Restrictions: Yes LUE Weight Bearing: Non weight bearing Other Position/Activity Restrictions: L shoulder sling     Mobility  Bed Mobility Overal bed mobility: Needs Assistance Bed Mobility: Supine to Sit;Sit to Supine     Supine to sit: Mod assist;Max assist;+2 for physical assistance;+2 for safety/equipment;HOB elevated Sit to supine: Max assist;+2 for physical assistance   General bed mobility comments: increased time, cues to sequence, cues and assist to initiate all movement including LEs toward EOB. Patient Response: Flat affect  Transfers                 General transfer comment: Attempt STS with +2MaxA with bed elevated. B knee blocking. Pt fearful and ensdorses L shoulder pain.  Ambulation/Gait             General Gait Details: Deferred due to inability to stand.   Stairs  Wheelchair Mobility    Modified Rankin (Stroke Patients Only)       Balance Overall balance assessment: Needs assistance Sitting-balance support: Feet supported;Single extremity supported Sitting balance-Leahy Scale: Poor Sitting balance - Comments: Intermittent needs for minA to maintain static sitting. POst lean noted majority of time. Able to correct in small bouts  with cuing to lean forwards. Postural control: Posterior lean   Standing balance-Leahy Scale: Zero Standing balance comment: unsuccessful with coming to stand with MAX A +2 from elevated surface.                            Cognition Arousal/Alertness: Awake/alert Behavior During Therapy: Flat affect Overall Cognitive Status: History of cognitive impairments - at baseline Area of Impairment: Orientation;Following commands;Problem solving                 Orientation Level: Disoriented to;Place;Time     Following Commands: Follows one step commands with increased time     Problem Solving: Decreased initiation;Requires verbal cues General Comments: pt oriented to self and situation, disorientd to time/place. Pt able to follow simple one step commands with extended processing time.      Exercises Other Exercises Other Exercises: PT/OT dynamic sitting balance tasks for core activation. Outside BOS RUE reaches to improve anterior weight shift and core activation to improve tolerance to sit upright without external support.    General Comments General comments (skin integrity, edema, etc.): BP vitals with no evidence of orthostatics. See OT and PT notes.      Pertinent Vitals/Pain Pain Assessment: Faces Faces Pain Scale: Hurts even more Pain Location: wincing with gentle attempts to adjust sling into correct position in prep for mobilization Pain Descriptors / Indicators: Grimacing;Guarding Pain Intervention(s): Limited activity within patient's tolerance;Monitored during session;Repositioned;RN gave pain meds during session    Home Living                      Prior Function            PT Goals (current goals can now be found in the care plan section) Acute Rehab PT Goals Patient Stated Goal: to have less pain PT Goal Formulation: With patient Time For Goal Achievement: 09/21/21 Potential to Achieve Goals: Fair Progress towards PT goals: Not  progressing toward goals - comment (Limited by L shoulder pain)    Frequency    Min 2X/week      PT Plan Current plan remains appropriate    Co-evaluation   Reason for Co-Treatment: Complexity of the patient's impairments (multi-system involvement);For patient/therapist safety;To address functional/ADL transfers PT goals addressed during session: Mobility/safety with mobility;Strengthening/ROM OT goals addressed during session: ADL's and self-care;Strengthening/ROM      AM-PAC PT "6 Clicks" Mobility   Outcome Measure  Help needed turning from your back to your side while in a flat bed without using bedrails?: A Lot Help needed moving from lying on your back to sitting on the side of a flat bed without using bedrails?: A Lot Help needed moving to and from a bed to a chair (including a wheelchair)?: Total Help needed standing up from a chair using your arms (e.g., wheelchair or bedside chair)?: Total Help needed to walk in hospital room?: Total Help needed climbing 3-5 steps with a railing? : Total 6 Click Score: 8    End of Session Equipment Utilized During Treatment: Gait belt;Other (comment) (L shoulder immobilizer) Activity Tolerance: Patient limited by pain Patient  left: in bed;with call bell/phone within reach;with bed alarm set Nurse Communication: Mobility status;Precautions PT Visit Diagnosis: Other abnormalities of gait and mobility (R26.89);Muscle weakness (generalized) (M62.81);History of falling (Z91.81);Difficulty in walking, not elsewhere classified (R26.2);Pain Pain - Right/Left: Left Pain - part of body: Shoulder     Time: 9518-8416 PT Time Calculation (min) (ACUTE ONLY): 26 min  Charges:  $Therapeutic Activity: 8-22 mins                     Delphia Grates. Fairly IV, PT, DPT Physical Therapist- [place]  [facility]  09/09/2021, 11:41 AM

## 2021-09-10 LAB — GLUCOSE, CAPILLARY: Glucose-Capillary: 97 mg/dL (ref 70–99)

## 2021-09-10 MED ORDER — RIVASTIGMINE 9.5 MG/24HR TD PT24: 9.5000 mg | MEDICATED_PATCH | Freq: Every day | TRANSDERMAL | Status: AC

## 2021-09-10 MED ORDER — METOPROLOL TARTRATE 25 MG PO TABS: 25.0000 mg | ORAL_TABLET | Freq: Two times a day (BID) | ORAL | Status: AC

## 2021-09-10 MED ORDER — VITAMIN B-12 1000 MCG PO TABS
500.0000 ug | ORAL_TABLET | Freq: Every day | ORAL | Status: DC
  Administered 2021-09-10: 500 ug via ORAL
  Filled 2021-09-10: qty 1

## 2021-09-10 MED ORDER — CYANOCOBALAMIN 500 MCG PO TABS: 500.0000 ug | ORAL_TABLET | Freq: Every day | ORAL | Status: AC

## 2021-09-10 NOTE — Care Management Important Message (Signed)
Important Message  Patient Details  Name: Justin Kim MRN: [id number] Date of Birth: 1946-07-16   Medicare Important Message Given:  Yes  Reviewed Medicare IM with sister, Jan Hemphill, at [phone].  Copy of Medicare IM left in patient's room for reference.     Johnell Comings 09/10/2021, 1:33 PM

## 2021-09-10 NOTE — Discharge Summary (Addendum)
Physician Discharge Summary  Justin Kim RWE:[id number] DOB: Jun 21, 1946 DOA: 09/06/2021  PCP: Pcp, No  Admit date: 09/06/2021 Discharge date: 09/10/2021  Discharge disposition: SNF   Recommendations for Outpatient Follow-Up:   Follow-up with Dr. Rosita Kim, orthopedic surgeon, in 1 to 2 weeks   Discharge Diagnosis:   Principal Problem:   Altered mental status Active Problems:   Generalized weakness   Acute lower UTI   Dementia due to Parkinson's disease without behavioral disturbance (HCC)   Parkinson disease (HCC)   Unspecified atrial fibrillation (HCC)   Closed fracture of left proximal humerus   AMS (altered mental status)    Discharge Condition: Stable.  Diet recommendation:  Diet Order             Diet - low sodium heart healthy           DIET DYS 3           DIET DYS 3 Room service appropriate? Yes with Assist; Fluid consistency: Nectar Thick  Diet effective now                     Code Status: Full Code     Hospital Course:   [name] is a 75 y.o. male with medical history significant for recent visit to the emergency room on 09/03/2021 for left humerus fracture following a fall, Lewy body dementia, Parkinson's disease, generalized weakness, atrial fibrillation (not on anticoagulation because of risk for falls), hypertension, who presented from the assisted living facility to the emergency room because of generalized weakness and confusion.   He was admitted to the hospital for acute metabolic encephalopathy.  He had abnormal urinalysis.  He was given IV Rocephin for 3 days.  Urine culture did not show any growth.  It is difficult to determine whether patient actually had a UTI or not.  He was evaluated by PT and OT recommended discharge to SNF.  He was taking high-dose vitamin D (Ergoalciferol) prior to admission.  However, this was discontinued because vitamin D levels are elevated.  He has been started on vitamin B12 supplement because of low  normal vitamin B12 level.  His condition has improved and is deemed stable for discharge to SNF today.   Medical Consultants:   Orthopedic surgeon, Dr. Rosita Kim   Discharge Exam:    Vitals:   09/09/21 1133 09/09/21 1952 09/10/21 0426 09/10/21 0744  BP: 124/86 117/77 119/87 (!) 153/90  Pulse: 72 69 74 86  Resp: 17 16 14 15   Temp: 98.7 F (37.1 C) 98.7 F (37.1 C) 98.6 F (37 C) 98.3 F (36.8 C)  TempSrc: Oral Oral Oral Oral  SpO2: 99% 98% 97% 98%  Weight:      Height:         GEN: NAD SKIN: Warm and dry EYES: No pallor or icterus ENT: MMM CV: Irregular rate and rhythm PULM: CTA B ABD: soft, ND, NT, +BS CNS: AAO x 1 (person), non focal EXT:, Tenderness in the left shoulder with some ecchymosis on the left arm.  Left arm immobilized in a sling   The results of significant diagnostics from this hospitalization (including imaging, microbiology, ancillary and laboratory) are listed below for reference.     Procedures and Diagnostic Studies:   CT HEAD WO CONTRAST ( )  Result Date: 09/06/2021 CLINICAL DATA:  Mental status change. EXAM: CT HEAD WITHOUT CONTRAST TECHNIQUE: Contiguous axial images were obtained from the base of the skull through the vertex without intravenous contrast. COMPARISON:  September 04, 2021 FINDINGS: Brain: No evidence of acute infarction, hemorrhage, hydrocephalus, extra-axial collection or mass lesion/mass effect. Moderate brain parenchymal volume loss and deep white matter microangiopathy. Vascular: No hyperdense vessel or unexpected calcification. Skull: Normal. Negative for fracture or focal lesion. Sinuses/Orbits: No acute finding. Other: None. IMPRESSION: 1. No acute intracranial abnormality. 2. Moderate brain parenchymal atrophy and chronic microvascular disease. Electronically Signed   By: Ted Mcalpine M.D.   On: 09/06/2021 14:37   MR BRAIN WO CONTRAST  Result Date: 09/06/2021 CLINICAL DATA:  Neuro deficit, acute, stroke suspected EXAM:  MRI HEAD WITHOUT CONTRAST TECHNIQUE: Multiplanar, multiecho pulse sequences of the brain and surrounding structures were obtained without intravenous contrast. COMPARISON:  Same day CT head. FINDINGS: Brain: No acute infarction, hemorrhage, hydrocephalus, extra-axial collection or mass lesion. Moderate atrophy with ex vacuo ventricular dilation. Moderate scattered T2 hyperintensities within the supratentorial and pontine white matter, nonspecific but compatible with chronic microvascular ischemic disease. Small foci of susceptibility artifact within the left cerebellum, likely the sequela of prior microhemorrhages. Vascular: Major arterial flow voids are maintained at the skull base. Skull and upper cervical spine: Normal marrow signal. Sinuses/Orbits: Mild to moderate scattered paranasal sinus mucosal thickening. No acute orbital findings. Other: Trace right mastoid fluid. IMPRESSION: 1. No evidence of acute intracranial abnormality. 2. 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 3. Mild-to-moderate paranasal sinus mucosal thickening. Electronically Signed   By: Feliberto Harts M.D.   On: 09/06/2021 17:03   DG Chest Portable 1 View  Result Date: 09/06/2021 CLINICAL DATA:  Increased weakness over the past 2 days. Decreased oral intake. Clinical concern for pneumonia. EXAM: PORTABLE CHEST 1 VIEW COMPARISON:  04/08/2021 FINDINGS: The cardiac silhouette remains borderline enlarged. Stable mild elevation of the left hemidiaphragm. Clear lungs. Mild thoracic spine degenerative changes. IMPRESSION: No acute abnormality. Electronically Signed   By: Beckie Salts M.D.   On: 09/06/2021 13:17     Labs:   Basic Metabolic Panel: Recent Labs  Lab 09/06/21 1224 09/06/21 1520 09/07/21 0437  NA 143  --  145  K 4.1  --  3.7  CL 111  --  115*  CO2 20*  --  24  GLUCOSE 112*  --  97  BUN 22  --  24*  CREATININE 0.82  --  0.82  CALCIUM 8.7*  --  8.3*  MG  --  2.0  --    GFR Estimated  Creatinine Clearance: 84.1 mL/min (by C-G formula based on SCr of 0.82 mg/dL). Liver Function Tests: Recent Labs  Lab 09/06/21 1224  AST 25  ALT 17  ALKPHOS 67  BILITOT 1.7*  PROT 6.3*  ALBUMIN 3.4*   No results for input(s): LIPASE, AMYLASE in the last 168 hours. Recent Labs  Lab 09/06/21 1748  AMMONIA 13   Coagulation profile No results for input(s): INR, PROTIME in the last 168 hours.  CBC: Recent Labs  Lab 09/06/21 1224 09/06/21 1422 09/07/21 0437  WBC SPECIMEN CLOTTED KATIE MARTINEZ @1327  ON 09/06/21 SKL 8.7 6.9  NEUTROABS PENDING 6.9  --   HGB SPECIMEN CLOTTED KATIE MARTINEZ @1327  ON 09/06/21 SKL 12.3* 11.7*  HCT SPECIMEN CLOTTED KATIE MARTINEZ @1327  ON 09/06/21 SKL 34.6* 32.8*  MCV SPECIMEN CLOTTED KATIE MARTINEZ @1327  ON 09/06/21 SKL 93.5 95.1  PLT SPECIMEN CLOTTED KATIE MARTINEZ @1327  ON 09/06/21 SKL 155 145*   Cardiac Enzymes: Recent Labs  Lab 09/06/21 1748  CKTOTAL 197   BNP: Invalid input(s): POCBNP CBG: Recent Labs  Lab 09/10/21 0742  GLUCAP 97  D-Dimer No results for input(s): DDIMER in the last 72 hours. Hgb A1c No results for input(s): HGBA1C in the last 72 hours. Lipid Profile No results for input(s): CHOL, HDL, LDLCALC, TRIG, CHOLHDL, LDLDIRECT in the last 72 hours. Thyroid function studies No results for input(s): TSH, T4TOTAL, T3FREE, THYROIDAB in the last 72 hours.  Invalid input(s): FREET3 Anemia work up No results for input(s): VITAMINB12, FOLATE, FERRITIN, TIBC, IRON, RETICCTPCT in the last 72 hours. Microbiology Recent Results (from the past 240 hour(s))  Resp Panel by RT-PCR (Flu A&B, Covid) Nasopharyngeal Swab     Status: None   Collection Time: 09/06/21 12:44 PM   Specimen: Nasopharyngeal Swab; Nasopharyngeal(NP) swabs in vial transport medium  Result Value Ref Range Status   SARS Coronavirus 2 by RT PCR NEGATIVE NEGATIVE Final    Comment: (NOTE) SARS-CoV-2 target nucleic acids are NOT DETECTED.  The SARS-CoV-2 RNA is  generally detectable in upper respiratory specimens during the acute phase of infection. The lowest concentration of SARS-CoV-2 viral copies this assay can detect is 138 copies/mL. A negative result does not preclude SARS-Cov-2 infection and should not be used as the sole basis for treatment or other patient management decisions. A negative result may occur with  improper specimen collection/handling, submission of specimen other than nasopharyngeal swab, presence of viral mutation(s) within the areas targeted by this assay, and inadequate number of viral copies(<138 copies/mL). A negative result must be combined with clinical observations, patient history, and epidemiological information. The expected result is Negative.  Fact Sheet for Patients:  BloggerCourse.com  Fact Sheet for Healthcare Providers:  SeriousBroker.it  This test is no t yet approved or cleared by the [place] FDA and  has been authorized for detection and/or diagnosis of SARS-CoV-2 by FDA under an Emergency Use Authorization (EUA). This EUA will remain  in effect (meaning this test can be used) for the duration of the COVID-19 declaration under Section 564(b)(1) of the Act, 21 U.S.C.section 360bbb-3(b)(1), unless the authorization is terminated  or revoked sooner.       Influenza A by PCR NEGATIVE NEGATIVE Final   Influenza B by PCR NEGATIVE NEGATIVE Final    Comment: (NOTE) The Xpert Xpress SARS-CoV-2/FLU/RSV plus assay is intended as an aid in the diagnosis of influenza from Nasopharyngeal swab specimens and should not be used as a sole basis for treatment. Nasal washings and aspirates are unacceptable for Xpert Xpress SARS-CoV-2/FLU/RSV testing.  Fact Sheet for Patients: BloggerCourse.com  Fact Sheet for Healthcare Providers: SeriousBroker.it  This test is not yet approved or cleared by the [place] FDA and has been authorized for detection and/or diagnosis of SARS-CoV-2 by FDA under an Emergency Use Authorization (EUA). This EUA will remain in effect (meaning this test can be used) for the duration of the COVID-19 declaration under Section 564(b)(1) of the Act, 21 U.S.C. section 360bbb-3(b)(1), unless the authorization is terminated or revoked.  Performed at [facility], [address] [place], [place] [address]   Urine Culture     Status: None   Collection Time: 09/06/21 12:44 PM   Specimen: Urine, Random  Result Value Ref Range Status   Specimen Description   Final    URINE, RANDOM Performed at [facility], [address] [place], [place] [address]    Special Requests   Final    NONE Performed at [facility], [address] [place], [place] [address]    Culture   Final    NO GROWTH Performed at  [facility], [address] [place], [place] [address]    Report Status 09/07/2021 FINAL  Final  SARS CORONAVIRUS 2 (TAT 6-24 HRS) Nasopharyngeal Nasopharyngeal Swab     Status: None   Collection Time: 09/09/21 12:00 PM   Specimen: Nasopharyngeal Swab  Result Value Ref Range Status   SARS Coronavirus 2 NEGATIVE NEGATIVE Final    Comment: (NOTE) SARS-CoV-2 target nucleic acids are NOT DETECTED.  The SARS-CoV-2 RNA is generally detectable in upper and lower respiratory specimens during the acute phase of infection. Negative results do not preclude SARS-CoV-2 infection, do not rule out co-infections with other pathogens, and should not be used as the sole basis for treatment or other patient management decisions. Negative results must be combined with clinical observations, patient history, and epidemiological information. The expected result is Negative.  Fact Sheet for Patients: HairSlick.no  Fact Sheet for Healthcare Providers: quierodirigir.com  This test is not  yet approved or cleared by the [place] FDA and  has been authorized for detection and/or diagnosis of SARS-CoV-2 by FDA under an Emergency Use Authorization (EUA). This EUA will remain  in effect (meaning this test can be used) for the duration of the COVID-19 declaration under Se ction 564(b)(1) of the Act, 21 U.S.C. section 360bbb-3(b)(1), unless the authorization is terminated or revoked sooner.  Performed at [facility], [address] [place], [place] [address]      Discharge Instructions:   Discharge Instructions     DIET DYS 3   Complete by: As directed    Fluid consistency: Nectar Thick   Diet - low sodium heart healthy   Complete by: As directed    Increase activity slowly   Complete by: As directed       Allergies as of 09/10/2021   No Known Allergies      Medication List     STOP taking these medications    carboxymethylcellulose 0.5 % Soln Commonly known as: REFRESH PLUS   multivitamin with minerals Tabs tablet   ondansetron 4 MG tablet Commonly known as: ZOFRAN   polyethylene glycol 17 g packet Commonly known as: MIRALAX / GLYCOLAX   senna-docusate 8.6-50 MG tablet Commonly known as: Senokot-S   Vitamin D (Ergocalciferol) 1.25 MG (50000 UNIT) Caps capsule Commonly known as: DRISDOL       TAKE these medications    (feeding supplement) PROSource Plus liquid Take 30 mLs by mouth 2 (two) times daily between meals.   acetaminophen 325 MG tablet Commonly known as: TYLENOL Take 2 tablets (650 mg total) by mouth every 6 (six) hours as needed for mild pain (or Fever >/= 101).   carbidopa-levodopa 25-100 MG tablet Commonly known as: SINEMET IR Take 1 tablet by mouth 3 (three) times daily.   diltiazem 180 MG 24 hr capsule Commonly known as: CARDIZEM CD Take 1 capsule (180 mg total) by mouth daily.   escitalopram 20 MG tablet Commonly known as: LEXAPRO Take 1 tablet by mouth daily.   metoprolol tartrate 25 MG  tablet Commonly known as: LOPRESSOR Take 1 tablet (25 mg total) by mouth 2 (two) times daily.   mirtazapine 30 MG tablet Commonly known as: REMERON Take 30 mg by mouth at bedtime.   Refresh Relieva 0.5-0.9 % ophthalmic solution Generic drug: carboxymethylcellul-glycerin Place 1 drop into both eyes 2 (two) times daily.   rivastigmine 9.5 mg/24hr Commonly known as: EXELON Place 1 patch (9.5 mg total) onto the skin daily. What changed: how to take this  vitamin B-12 500 MCG tablet Commonly known as: CYANOCOBALAMIN Take 1 tablet (500 mcg total) by mouth daily. Start taking on: September 11, 2021        Contact information for after-discharge care     Destination     [facility] .   Service: Skilled Nursing Contact information: [address] [place] [address] [phone]                       If you experience worsening of your admission symptoms, develop shortness of breath, life threatening emergency, suicidal or homicidal thoughts you must seek medical attention immediately by calling 911 or calling your MD immediately  if symptoms less severe.   You must read complete instructions/literature along with all the possible adverse reactions/side effects for all the medicines you take and that have been prescribed to you. Take any new medicines after you have completely understood and accept all the possible adverse reactions/side effects.    Please note   You were cared for by a hospitalist during your hospital stay. If you have any questions about your discharge medications or the care you received while you were in the hospital after you are discharged, you can call the unit and asked to speak with the hospitalist on call if the hospitalist that took care of you is not available. Once you are discharged, your primary care physician will handle any further medical issues. Please note that NO REFILLS for any discharge  medications will be authorized once you are discharged, as it is imperative that you return to your primary care physician (or establish a relationship with a primary care physician if you do not have one) for your aftercare needs so that they can reassess your need for medications and monitor your lab values.       Time coordinating discharge: 32 minutes  Signed:  Altha Sweitzer  [facility] 09/10/2021, 1:45 PM   Pager on [email or web address]. If 7PM-7AM, please contact night-coverage at [email or web address]

## 2021-09-10 NOTE — Plan of Care (Signed)
  Problem: Activity: Goal: Risk for activity intolerance will decrease Outcome: Progressing   Problem: Nutrition: Goal: Adequate nutrition will be maintained Outcome: Progressing   Problem: Skin Integrity: Goal: Risk for impaired skin integrity will decrease Outcome: Progressing   Problem: Nutrition Goal: Patient maintains adequate hydration Outcome: Progressing

## 2021-09-10 NOTE — Progress Notes (Signed)
Report called to Raynelle Fanning, Charity fundraiser at [employer]. AVS document placed in packet. Pt transported to facility via [facility] in stable condition.

## 2021-09-10 NOTE — TOC Transition Note (Signed)
Transition of Care [facility]) - CM/SW Discharge Note   Patient Details  Name: Justin Kim MRN: [id number] Date of Birth: July 16, 1946  [facility] CM/SW Contact:  Margarito Liner, LCSW Phone Number: 09/10/2021, 2:33 PM   Clinical Narrative:   Patient has orders to discharge to [facility] today. RN will call report to [phone] (Room E14). EMS has been arranged and he is first on the list. No further concerns. CSW signing off.  Final next level of care: Skilled Nursing Facility Barriers to Discharge: Barriers Resolved   Patient Goals and CMS Choice     Choice offered to / list presented to : Sibling  Discharge Placement PASRR number recieved: 09/08/21            Patient chooses bed at: Other - please specify in the comment section below: ([facility]) Patient to be transferred to facility by: EMS Name of family member notified: Jan Hemphill Patient and family notified of of transfer: 09/10/21  Discharge Plan and Services     Post Acute Care Choice: Skilled Nursing Facility                               Social Determinants of Health (SDOH) Interventions     Readmission Risk Interventions No flowsheet data found.

## 2021-09-12 LAB — VITAMIN B1: Vitamin B1 (Thiamine): 209.3 nmol/L — ABNORMAL HIGH (ref 66.5–200.0)

## 2022-06-25 IMAGING — CT CT HEAD W/O CM
3 series · 14 of 47 positions shown, 16 images · non-contrast
Comparison: September 04, 2021

CLINICAL DATA: Mental status change.

EXAM:
CT HEAD WITHOUT CONTRAST
TECHNIQUE: Contiguous axial images were obtained from the base of the skull
through the vertex without intravenous contrast.

[Series 3: ax head wo · axial · 0.39mm/px · z∈[-92,+62]mm · 8 of 38 slices shown, 10 images]
[im 3/38  brain]
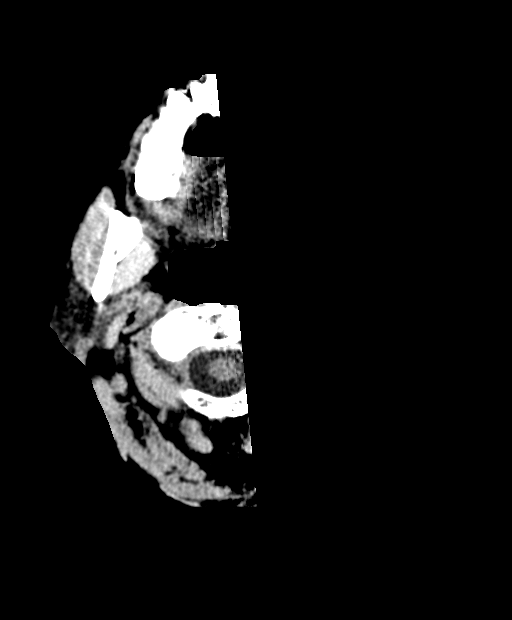
[im 3/38  bone]
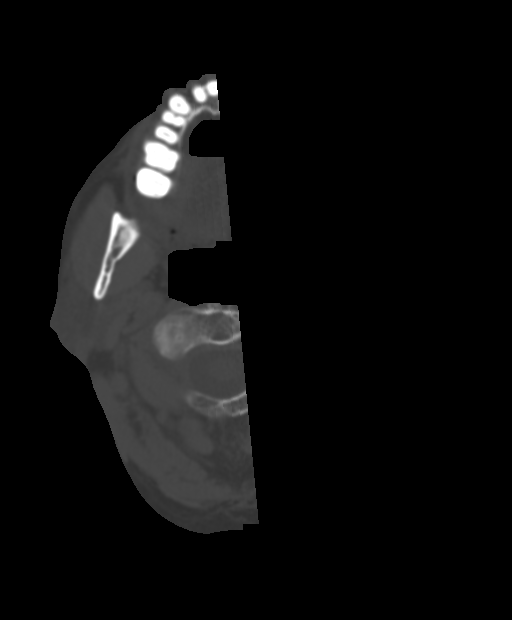
[im 8/38  brain]
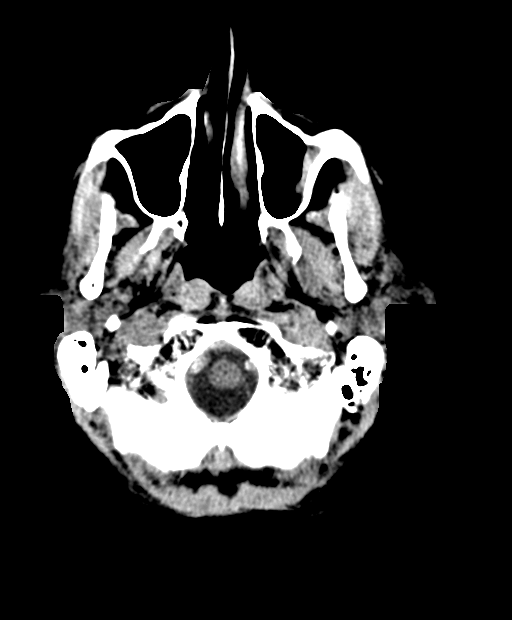
[im 12/38  brain]
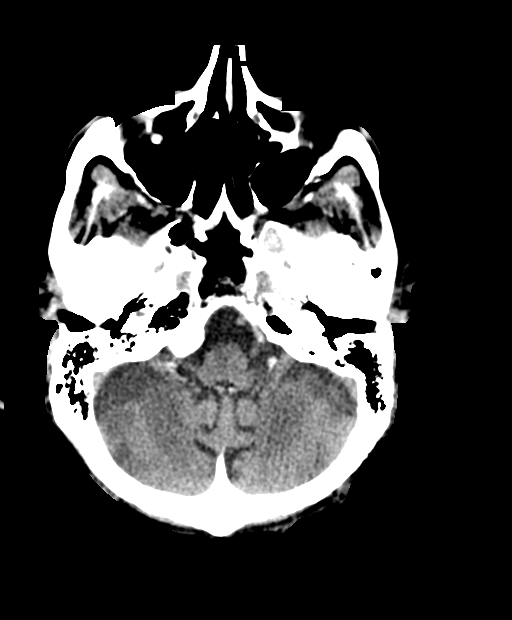
[im 17/38  brain]
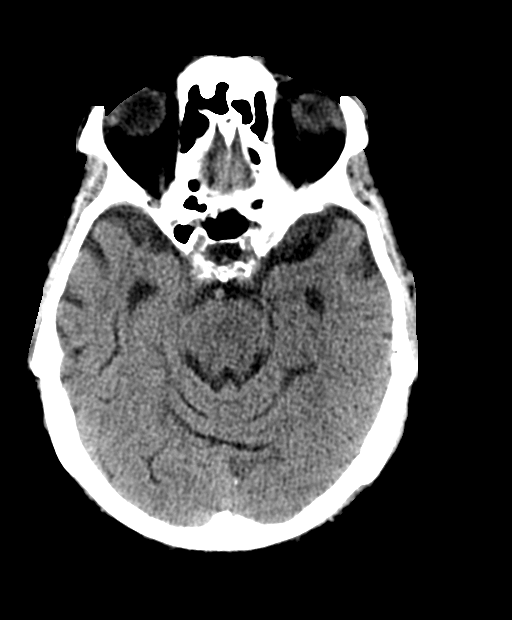
[im 21/38  brain]
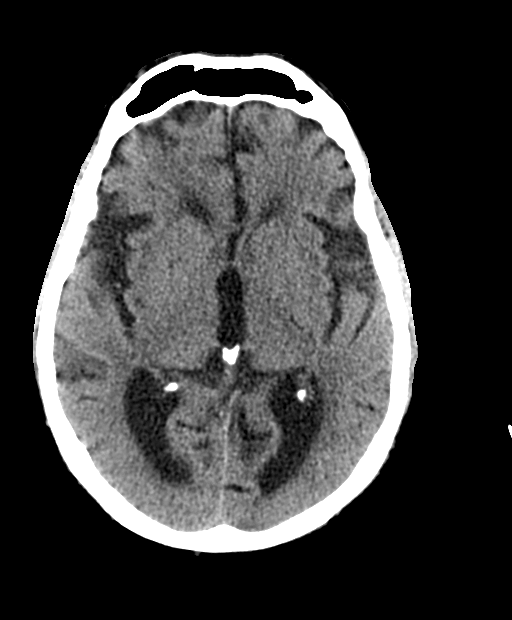
[im 21/38  bone]
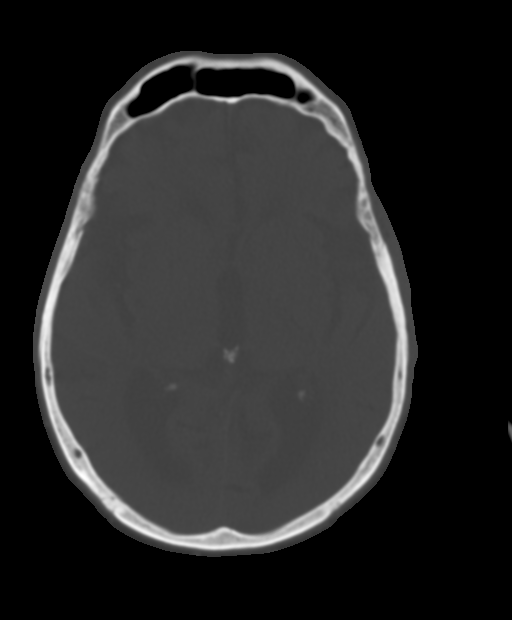
[im 26/38  brain]
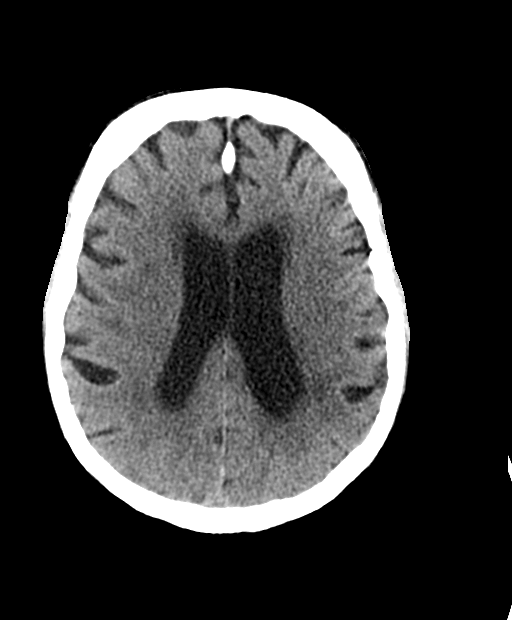
[im 30/38  brain]
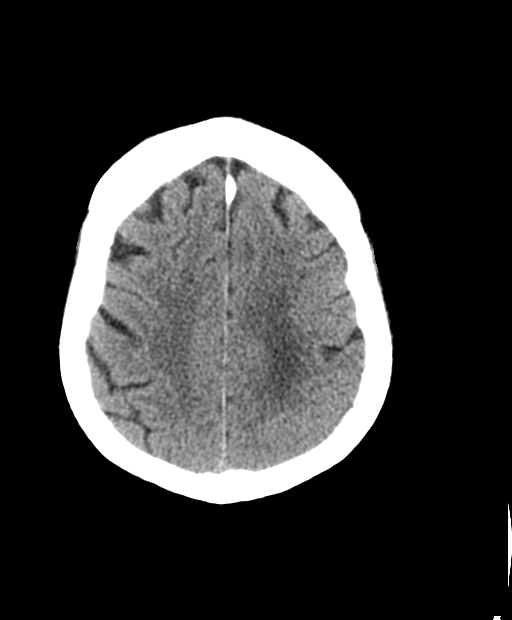
[im 35/38  brain]
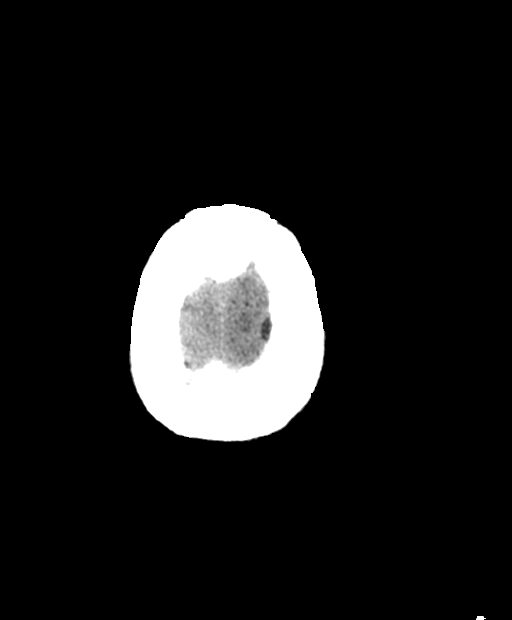

[Series 5: coronal soft tissue · coronal · 0.35mm/px · 3 of 69 slices shown]
[im 23/69  brain]
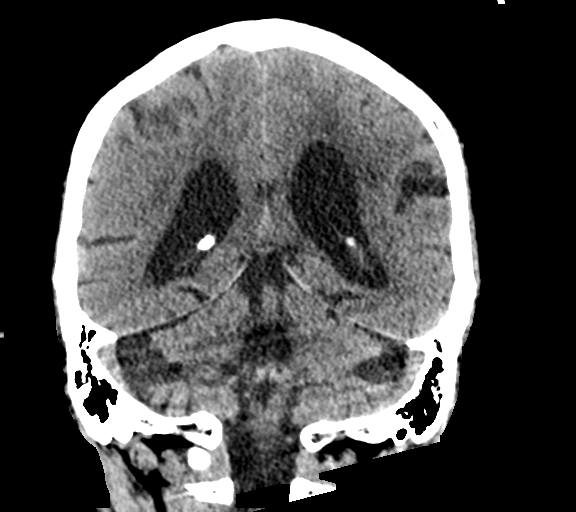
[im 31/69  brain]
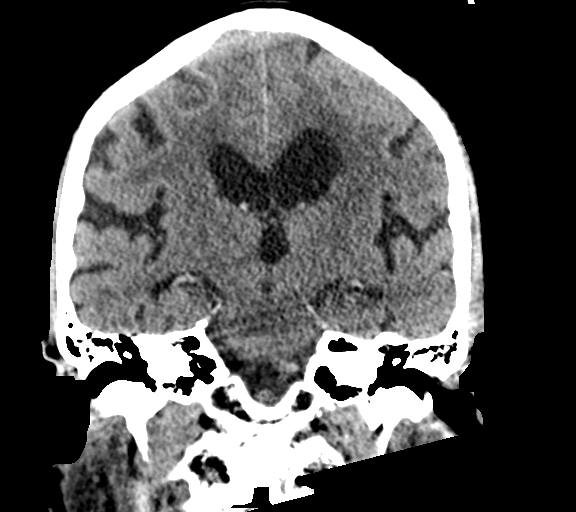
[im 38/69  brain]
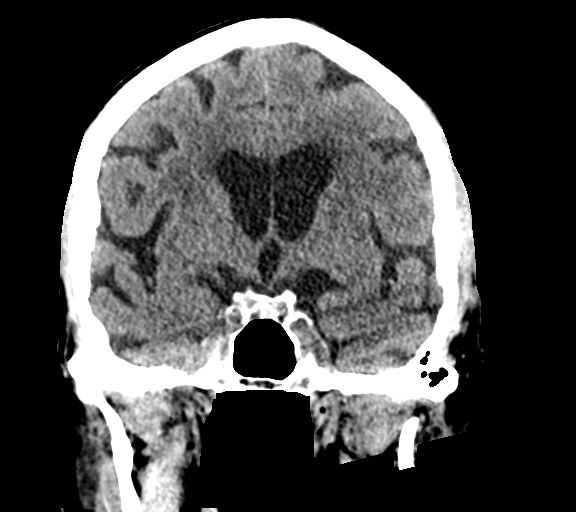

[Series 6: sagittal soft tissue · sagittal · 0.35mm/px · 3 of 56 slices shown]
[im 24/56  brain]
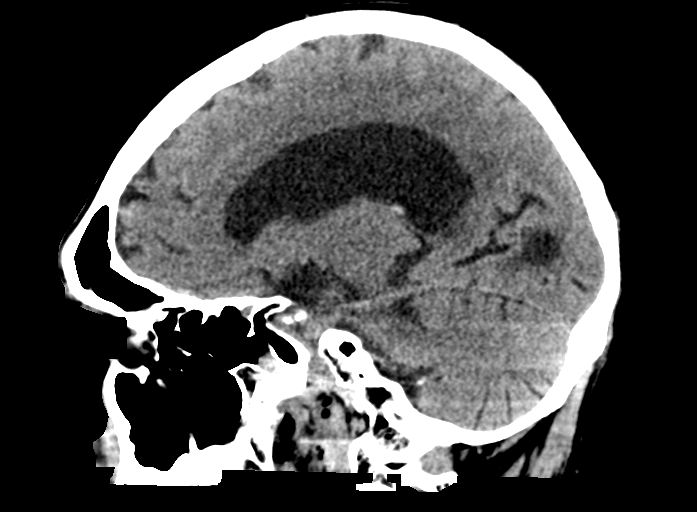
[im 28/56  brain]
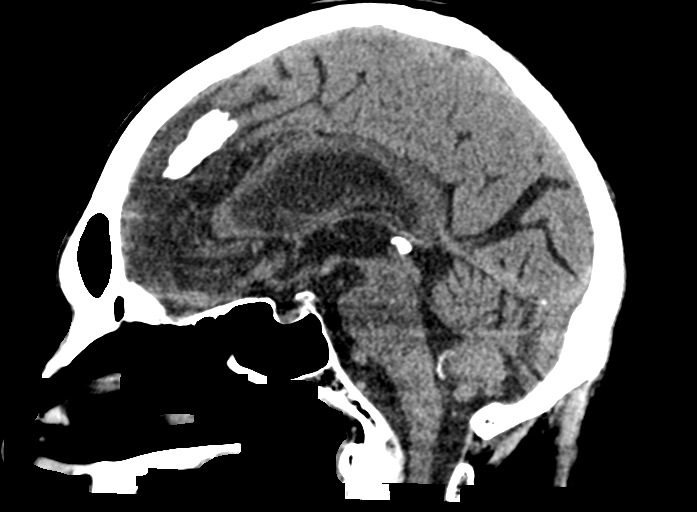
[im 32/56  brain]
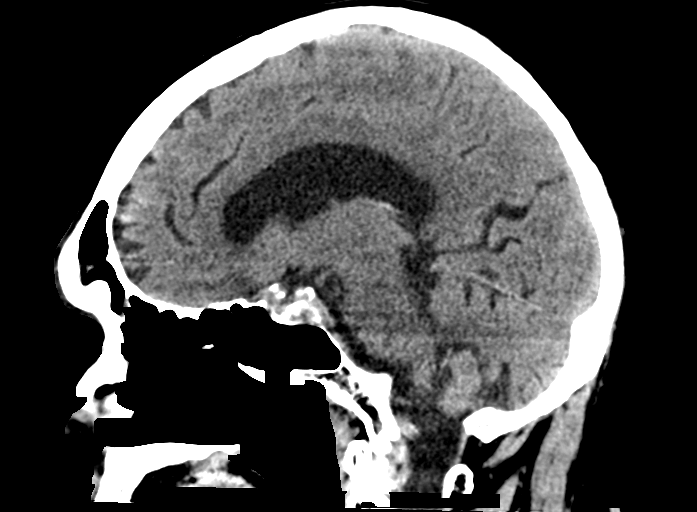

[14 of 47 positions shown; findings below may reference images not displayed]

FINDINGS: Brain: No evidence of acute infarction, hemorrhage, hydrocephalus,
extra-axial collection or mass lesion/mass effect. Moderate brain
parenchymal volume loss and deep white matter microangiopathy.

Vascular: No hyperdense vessel or unexpected calcification.

Skull: Normal. Negative for fracture or focal lesion.

Sinuses/Orbits: No acute finding.

Other: None.
IMPRESSION: 1. No acute intracranial abnormality.
2. Moderate brain parenchymal atrophy and chronic microvascular
disease.

## 2023-10-31 ENCOUNTER — Other Ambulatory Visit: Payer: Self-pay | Admitting: Family Medicine

## 2023-10-31 DIAGNOSIS — R633 Feeding difficulties, unspecified: Secondary | ICD-10-CM

## 2023-10-31 DIAGNOSIS — R1312 Dysphagia, oropharyngeal phase: Secondary | ICD-10-CM

## 2023-11-03 ENCOUNTER — Ambulatory Visit
Admission: RE | Admit: 2023-11-03 | Discharge: 2023-11-03 | Disposition: A | Discharge status: Home / Self Care | Payer: Medicare Other | Source: Ambulatory Visit | Attending: Family Medicine | Admitting: Family Medicine

## 2023-11-03 DIAGNOSIS — R1312 Dysphagia, oropharyngeal phase: Secondary | ICD-10-CM | POA: Insufficient documentation

## 2023-11-03 DIAGNOSIS — R633 Feeding difficulties, unspecified: Secondary | ICD-10-CM | POA: Diagnosis present

## 2023-11-03 NOTE — Therapy (Signed)
Modified Barium Swallow Study  Patient Details  Name: Justin Kim MRN: [id number] Date of Birth: 1946-07-13  Today's Date: 11/03/2023  Modified Barium Swallow completed.  Full report located under Chart Review in the Imaging Section.  History of Present Illness Pt is a 77 y.o. male who presents from facility for MBSS. Pt with hx of Parkinson's Disease and Lewy Body Dementia. Pt unable to provide any details of swallow function.   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 Pt with prolonged/inefficient oral prep of liquid and puree boluses, moderate-severe oral and pharyngeal stasis, delayed and x1 absent swallow initiation, and observed aspiration across all consistencies. Aspiration occurred before the swallow with thin liquids as well as before, during, and after the swallow with thin liquids, nectar-thick liquids, honey-thick liquids, and pudding. All aspiration observed during this study was SILENT in nature. Pt with inconsistent bolus acceptance during the study, but participation was adequate enough to observe pt's pattern of swallowing physiology.  At present, a safe oral diet cannot be recommended. Recommend Palliative Care Consult to discuss goals of care given pt's advanced age, co-morbidities, and overall functional status. Factors that may increase risk of adverse event in presence of aspiration Justin Kim 2021): Poor general health and/or compromised immunity;Reduced cognitive function;Limited mobility;Frail or deconditioned;Aspiration of thick, dense, and/or acidic materials;Frequent aspiration of large volumes  Swallow Evaluation Recommendations Recommendations: NPO - a safe oral diet cannot be recommended; no benefit of thickening liquids if pt would like to pursue a comfort diet Recommended consults: Consider Palliative care     Justin Kim, M.S.,  Justin Kim Speech-Language Pathologist [facility] [phone] (ASCOM)  Justin Kim 11/03/2023,1:54 PM

## 2023-11-03 NOTE — Therapy (Signed)
Modified Barium Swallow Study  Patient Details  Name: Justin Kim MRN: [id number] Date of Birth: 26-Nov-1946  Today's Date: 11/03/2023  HPI/PMH: HPI: Pt is a 77 y.o. male who presents from facility for MBSS. Pt with hx of Parkinson's Disease and Lewy Body Dementia. Pt unable to provide any details of swallow function.   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 Pt with prolonged/inefficient oral prep of liquid and puree boluses, moderate-severe oral and pharyngeal stasis, delayed and x1 absent swallow initiation, and observed aspiration across all consistencies. Aspiration occurred before the swallow with thin liquids as well as before, during, and after the swallow with thin liquids, nectar-thick liquids, honey-thick liquids, and pudding. All aspiration observed during this study was SILENT in nature. Pt with inconsistent bolus acceptance during the study, but participation was adequate enough to observe pt's pattern of swallowing physiology.  At present, a safe oral diet cannot be recommended. Recommend Palliative Care Consult to discuss goals of care given pt's advanced age, co-morbidities, and overall functional status.  Factors that may increase risk of adverse event in presence of aspiration Rubye Oaks & Clearance Coots 2021): Factors that may increase risk of adverse event in presence of aspiration Rubye Oaks & Clearance Coots 2021): Poor general health and/or compromised immunity; Reduced cognitive function; Limited mobility; Frail or deconditioned; Aspiration of thick, dense, and/or acidic materials; Frequent aspiration of large volumes   Recommendations/Plan: Swallowing Evaluation Recommendations Swallowing Evaluation Recommendations Recommendations: NPO - a safe oral diet cannot be recommended; no benefit of thickening liquids if pt would like to pursue a comfort  diet Recommended consults: Consider Palliative care    Treatment Plan Treatment Plan Treatment recommendations: No treatment recommended at this time Follow-up recommendations: -- (at facility where pt resides)     Recommendations Recommendations for follow up therapy are one component of a multi-disciplinary discharge planning process, led by the attending physician.  Recommendations may be updated based on patient status, additional functional criteria and insurance authorization.  Assessment: Orofacial Exam: Orofacial Exam Oral Cavity: Oral Hygiene: Dried secretions; Lingual coating Oral Cavity - Dentition: Adequate natural dentition Orofacial Anatomy: -- (no obvious deficits; intermittent ability to follow commands)    Anatomy:  Anatomy: WFL   Boluses Administered: Boluses Administered Boluses Administered: Thin liquids (Level 0); Mildly thick liquids (Level 2, nectar thick); Moderately thick liquids (Level 3, honey thick); Puree     Oral Impairment Domain: Oral Impairment Domain Lip Closure: Escape from interlabial space or lateral juncture, no extension beyond vermillion border Tongue control during bolus hold: Not tested Bolus preparation/mastication: -- (n/a) Bolus transport/lingual motion: Repetitive/disorganized tongue motion Oral residue: Majority of bolus remaining Location of oral residue : Floor of mouth; Tongue; Palate; Lateral sulci Initiation of pharyngeal swallow : No visible initiation at any location     Pharyngeal Impairment Domain: Pharyngeal Impairment Domain Soft palate elevation: No bolus between soft palate (SP)/pharyngeal wall (PW) Laryngeal elevation: No superior movement of thyroid cartilage Anterior hyoid excursion: No anterior movement Epiglottic movement: No inversion Laryngeal vestibule closure: None, wide column air/contrast in laryngeal vestibule Pharyngeal stripping wave : Present - diminished Pharyngeal contraction (A/P view  only): N/A Pharyngoesophageal segment opening: Minimal distention/minimal duration, marked obstruction of flow Tongue base retraction: Wide column of contrast or air between tongue base and PPW Pharyngeal residue: Minimal to no pharyngeal clearance Location of pharyngeal residue: Diffuse (>3 areas)     Esophageal Impairment Domain: Esophageal Impairment Domain Esophageal  clearance upright position: -- (n/a)    Pill: Pill Consistency administered: -- (n/a)    Penetration/Aspiration Scale Score: Penetration/Aspiration Scale Score 8.  Material enters airway, passes BELOW cords without attempt by patient to eject out (silent aspiration) : Thin liquids (Level 0); Mildly thick liquids (Level 2, nectar thick); Moderately thick liquids (Level 3, honey thick); Puree    Compensatory Strategies: No data recorded     General Information: Caregiver present: No   No data recorded   No data recorded   Respiratory Status: WFL    Supplemental O2: None (Room air)    No data recorded  No data recorded Self-Feeding Abilities: Other (Comment) (SLP fed during study.)  Baseline vocal quality/speech: Aphonic; Dysphonic; Hypophonia/low volume; Other (comment)  Volitional Cough: Unable to elicit  Volitional Swallow: Unable to elicit  Exam Limitations: Poor bolus acceptance   Goal Planning: Prognosis for improved oropharyngeal function: Guarded  Barriers to Reach Goals: Cognitive deficits; Severity of deficits; Behavior; Overall medical prognosis  No data recorded Patient/Family Stated Goal: none  No data recorded  Pain: Pain Assessment Pain Assessment: PAINAD Breathing: 1 Negative Vocalization: 1 Facial Expression: 1 Body Language: 1 Consolability: 1 PAINAD Score: 5    End of Session: Start Time:No data recorded Stop Time: No data recorded Time Calculation:No data recorded Charges: SLP Evaluations $ SLP Speech Visit: 1 Visit  SLP Evaluations $MBS Swallow: 1  Procedure   SLP visit diagnosis: SLP Visit Diagnosis: Dysphagia, oropharyngeal phase (R13.12)    Past Medical History:  Past Medical History:  Diagnosis Date   Dementia (HCC)    Hypertension    Past Surgical History:  Past Surgical History:  Procedure Laterality Date   NO PAST SURGERIES     Clyde Canterbury, M.S., CCC-SLP Speech-Language Pathologist [facility] [phone] (ASCOM)  Woodroe Chen 11/03/2023, 1:59 PM

## 2023-11-29 ENCOUNTER — Encounter (INDEPENDENT_AMBULATORY_CARE_PROVIDER_SITE_OTHER): Payer: Self-pay | Admitting: Vascular Surgery

## 2023-11-29 ENCOUNTER — Ambulatory Visit (INDEPENDENT_AMBULATORY_CARE_PROVIDER_SITE_OTHER): Payer: Medicare Other | Admitting: Vascular Surgery

## 2023-11-29 ENCOUNTER — Other Ambulatory Visit (INDEPENDENT_AMBULATORY_CARE_PROVIDER_SITE_OTHER): Payer: Medicare Other

## 2023-11-29 VITALS — BP 109/78 | HR 76 | Resp 18 | Ht 70.0 in | Wt 182.0 lb

## 2023-11-29 DIAGNOSIS — M7989 Other specified soft tissue disorders: Secondary | ICD-10-CM | POA: Diagnosis not present

## 2023-11-29 DIAGNOSIS — R230 Cyanosis: Secondary | ICD-10-CM

## 2023-11-29 DIAGNOSIS — F0280 Dementia in other diseases classified elsewhere without behavioral disturbance: Secondary | ICD-10-CM

## 2023-11-29 DIAGNOSIS — G20A1 Parkinson's disease without dyskinesia, without mention of fluctuations: Secondary | ICD-10-CM

## 2023-11-29 NOTE — Assessment & Plan Note (Signed)
Quite progressed.  The patient is immobile.

## 2023-11-29 NOTE — Assessment & Plan Note (Signed)
The patient is immobile with dependent legs and has purplish discoloration.  He certainly is at risk of atherosclerotic peripheral arterial disease and is reasonable to assess ABIs.  He is more likely to have venous disease and I have given him a prescription for some compression socks and asked them to try to elevate his legs is much as possible.  Will obtain a venous reflux study in the near future at his convenience.

## 2023-11-29 NOTE — Progress Notes (Signed)
Patient ID: Justin Kim, male   DOB: [birth year], 77 y.o.   MRN: [id number]  Chief Complaint  Patient presents with   New Patient (Initial Visit)    NP. consult. edema/temp change both feet. slade-hartman    HPI Justin Kim is a 77 y.o. male.  I am asked to see the patient by Dr. Lovenia Kim for evaluation of leg swelling, coolness, and discoloration.  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  He is nonverbal and apparently also has advanced dementia.  His facility worker who comes with him today provides as much history as she can, but the patient is unable to provide any history.  He has not been mobile or ambulatory in the years he has been at the nursing facility.  His legs are dependent pretty much all day and he is in a wheelchair.  They are purplish in color but he does not have any open wounds or ulcers.  There has been mild to moderate swelling for many months.  No fevers or chills.  No chest pain or shortness of breath.  Patient is unable to provide any history.     Past Medical History:  Diagnosis Date   Dementia (HCC)    Hypertension     Past Surgical History:  Procedure Laterality Date   NO PAST SURGERIES       Family History  Family history unknown: Yes  Unable to obtain, patient unable to provide history    Social History   Tobacco Use   Smoking status: Never   Smokeless tobacco: Never  Substance Use Topics   Alcohol use: Not Currently   Drug use: Not Currently    No Known Allergies  Current Outpatient Medications  Medication Sig Dispense Refill   acetaminophen (TYLENOL) 325 MG tablet Take 2 tablets (650 mg total) by mouth every 6 (six) hours as needed for mild pain (or Fever >/= 101).     carbidopa-levodopa (SINEMET IR) 25-100 MG tablet Take 1 tablet by mouth 3 (three) times daily.     carboxymethylcellul-glycerin (REFRESH RELIEVA) 0.5-0.9 % ophthalmic solution Place 1 drop into both eyes 2 (two) times daily.      diltiazem (TIAZAC) 180 MG 24 hr capsule Take by mouth.     escitalopram (LEXAPRO) 20 MG tablet Take 1 tablet by mouth daily.     furosemide (LASIX) 20 MG tablet      metoprolol tartrate (LOPRESSOR) 25 MG tablet Take 1 tablet (25 mg total) by mouth 2 (two) times daily.     mirtazapine (REMERON) 30 MG tablet Take 30 mg by mouth at bedtime.     Nutritional Supplements (,FEEDING SUPPLEMENT, PROSOURCE PLUS) liquid Take 30 mLs by mouth 2 (two) times daily between meals.     REFRESH LIQUIGEL 1 % GEL Apply to eye.     rivastigmine (EXELON) 9.5 mg/24hr Place 1 patch (9.5 mg total) onto the skin daily.     vitamin B-12 (CYANOCOBALAMIN) 500 MCG tablet Take 1 tablet (500 mcg total) by mouth daily.     No current facility-administered medications for this visit.      REVIEW OF SYSTEMS (Negative unless checked)  Constitutional: [] Weight loss  [] Fever  [] Chills Cardiac: [] Chest pain   [] Chest pressure   [] Palpitations   [] Shortness of breath when laying flat   [] Shortness of breath at rest   [] Shortness of breath with exertion. Vascular:  [] Pain in legs with walking   [] Pain in legs at rest   [] Pain  in legs when laying flat   [] Claudication   [] Pain in feet when walking  [] Pain in feet at rest  [] Pain in feet when laying flat   [] History of DVT   [] Phlebitis   [x] Swelling in legs   [] Varicose veins   [] Non-healing ulcers Pulmonary:   [] Uses home oxygen   [] Productive cough   [] Hemoptysis   [] Wheeze  [] COPD   [] Asthma Neurologic:  [] Dizziness  [] Blackouts   [] Seizures   [] History of stroke   [] History of TIA  [] Aphasia   [] Temporary blindness   [x] Dysphagia   [] Weakness or numbness in arms   [] Weakness or numbness in legs Musculoskeletal:  [] Arthritis   [] Joint swelling   [] Joint pain   [] Low back pain Hematologic:  [] Easy bruising  [] Easy bleeding   [] Hypercoagulable state   [] Anemic  [] Hepatitis Gastrointestinal:  [] Blood in stool   [] Vomiting blood  [x] Gastroesophageal reflux/heartburn   [] Abdominal  pain Genitourinary:  [] Chronic kidney disease   [] Difficult urination  [] Frequent urination  [] Burning with urination   [] Hematuria Skin:  [] Rashes   [] Ulcers   [] Wounds Psychological:  [] History of anxiety   []  History of major depression.    Physical Exam BP 109/78 (BP Location: Right Arm)   Pulse 76   Resp 18   Ht 5\' 10"  (1.778 m)   Wt 182 lb (82.6 kg)   BMI 26.11 kg/m  Gen:  elderly and debilitated, left hemiparetic Head: Justin Kim/AT, + temporalis wasting.  Ear/Nose/Throat: Hearing grossly intact, nares w/o erythema or drainage, oropharynx w/o Erythema/Exudate Eyes: Conjunctiva clear, sclera non-icteric  Neck: trachea midline.  No JVD.  Pulmonary:  Good air movement, respirations not labored, no use of accessory muscles  Cardiac: irregular Vascular:  Vessel Right Left  Radial Palpable Palpable                                   Gastrointestinal:. No masses, surgical incisions, or scars. Musculoskeletal: In a wheelchair, left hemiparesis.Legs are somewhat purplish and cool, but no open wounds or ulcers. 1+ BLE edema. Neurologic:  Speech is absent. Left hemiparesis  Psychiatric: Judgment and insight are lacking, and patient is non-communicative.  Dermatologic: No rashes or ulcers noted.  No cellulitis or open wounds.    Radiology DG Micah Flesher OP [facility] SPEECH PATH  Result Date: 11/03/2023 Table formatting from the original result was not included. CLINICAL DATA:  Patient with a history of Parkinson's disease and Lewy body dementia. Patient with a history of profound oropharyngeal dysphagia. EXAM: MODIFIED BARIUM SWALLOW TECHNIQUE: Different consistencies of barium were administered orally to the patient by the Speech Pathologist. Imaging of the pharynx was performed in the lateral projection. Alwyn Ren, NP was present in the fluoroscopy room during this study, which was supervised and interpreted by Dr. Agustin Cree. Radiologist was not in attendance for the exam.  FLUOROSCOPY: Radiation Exposure Index (as provided by the fluoroscopic device): 6.70 mGy Kerma COMPARISON:  None Available. FINDINGS: Vestibular  Penetration:  Observed with all consistencies of barium. Aspiration: Silent aspiration observed with all consistencies of barium. Other:  None. IMPRESSION: Silent aspiration observed with all consistencies of barium. Please refer to the Speech Pathologists report for complete details and recommendations. Modified Barium Swallow Study Patient Details Name: Justin Kim MRN: [id number] Date of Birth: 1946/11/03 Today's Date: 11/03/2023 HPI/PMH: HPI: Pt is a 77 y.o. male who presents from facility for MBSS. Pt with hx of Parkinson's Disease and Lewy  Body Dementia. Pt unable to provide any details of swallow function. 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 Pt with prolonged/inefficient oral prep of liquid and puree boluses, moderate-severe oral and pharyngeal stasis, delayed and x1 absent swallow initiation, and observed aspiration across all consistencies. Aspiration occurred before the swallow with thin liquids as well as before, during, and after the swallow with thin liquids, nectar-thick liquids, honey-thick liquids, and pudding. All aspiration observed during this study was SILENT in nature. Pt with inconsistent bolus acceptance during the study, but participation was adequate enough to observe pt's pattern of swallowing physiology.  At present, a safe oral diet cannot be recommended. Recommend Palliative Care Consult to discuss goals of care given pt's advanced age, co-morbidities, and overall functional status. Factors that may increase risk of adverse event in presence of aspiration Rubye Oaks & Clearance Coots 2021): Factors that may increase risk of adverse event in presence of aspiration Rubye Oaks & Clearance Coots 2021): Poor general health and/or  compromised immunity; Reduced cognitive function; Limited mobility; Frail or deconditioned; Aspiration of thick, dense, and/or acidic materials; Frequent aspiration of large volumes Recommendations/Plan: Swallowing Evaluation Recommendations Swallowing Evaluation Recommendations Recommendations: NPO - a safe diet cannot be recommended; no benefit to thickening liquids if pt wishes to pursue a comfort diet Recommended consults: Consider Palliative care Treatment Plan Treatment Plan Treatment recommendations: No treatment recommended at this time Follow-up recommendations: -- (at facility where pt resides) Recommendations Recommendations for follow up therapy are one component of a multi-disciplinary discharge planning process, led by the attending physician.  Recommendations may be updated based on patient status, additional functional criteria and insurance authorization. Assessment: Orofacial Exam: Orofacial Exam Oral Cavity: Oral Hygiene: Dried secretions; Lingual coating Oral Cavity - Dentition: Adequate natural dentition Orofacial Anatomy: -- (no obvious deficits; intermittent ability to follow commands) Anatomy: Anatomy: WFL Boluses Administered: Boluses Administered Boluses Administered: Thin liquids (Level 0); Mildly thick liquids (Level 2, nectar thick); Moderately thick liquids (Level 3, honey thick); Puree  Oral Impairment Domain: Oral Impairment Domain Lip Closure: Escape from interlabial space or lateral juncture, no extension beyond vermillion border Tongue control during bolus hold: Not tested Bolus preparation/mastication: -- (n/a) Bolus transport/lingual motion: Repetitive/disorganized tongue motion Oral residue: Majority of bolus remaining Location of oral residue : Floor of mouth; Tongue; Palate; Lateral sulci Initiation of pharyngeal swallow : No visible initiation at any location  Pharyngeal Impairment Domain: Pharyngeal Impairment Domain Soft palate elevation: No bolus between soft palate  (SP)/pharyngeal wall (PW) Laryngeal elevation: No superior movement of thyroid cartilage Anterior hyoid excursion: No anterior movement Epiglottic movement: No inversion Laryngeal vestibule closure: None, wide column air/contrast in laryngeal vestibule Pharyngeal stripping wave : Present - diminished Pharyngeal contraction (A/P view only): N/A Pharyngoesophageal segment opening: Minimal distention/minimal duration, marked obstruction of flow Tongue base retraction: Wide column of contrast or air between tongue base and PPW Pharyngeal residue: Minimal to no pharyngeal clearance Location of pharyngeal residue: Diffuse (>3 areas)  Esophageal Impairment Domain: Esophageal Impairment Domain Esophageal clearance upright position: -- (n/a) Pill: Pill Consistency administered: -- (n/a) Penetration/Aspiration Scale Score: Penetration/Aspiration Scale Score 8.  Material enters airway, passes BELOW cords without attempt by patient to eject out (silent aspiration) : Thin liquids (Level 0); Mildly thick liquids (Level 2, nectar thick); Moderately thick liquids (Level 3, honey thick); Puree Compensatory Strategies: No data recorded  General Information: Caregiver present: No  No data recorded  No data recorded  Respiratory Status: WFL   Supplemental O2: None (Room air)   No data recorded No data recorded Self-Feeding Abilities: Other (Comment) (SLP fed during study.) Baseline vocal quality/speech: Aphonic; Dysphonic; Hypophonia/low volume; Other (comment) Volitional Cough: Unable to elicit Volitional Swallow: Unable to elicit Exam Limitations: Poor bolus acceptance Goal Planning: Prognosis for improved oropharyngeal function: Guarded Barriers to Reach Goals: Cognitive deficits; Severity of deficits; Behavior; Overall medical prognosis No data recorded Patient/Family Stated Goal: none No data recorded Pain: Pain Assessment Pain Assessment: PAINAD Breathing: 1 Negative Vocalization: 1 Facial Expression: 1 Body Language: 1  Consolability: 1 PAINAD Score: 5 End of Session: Start Time:No data recorded Stop Time: No data recorded Time Calculation:No data recorded Charges: SLP Evaluations $ SLP Speech Visit: 1 Visit SLP Evaluations $MBS Swallow: 1 Procedure SLP visit diagnosis: SLP Visit Diagnosis: Dysphagia, oropharyngeal phase (R13.12) Past Medical History: Past Medical History: Diagnosis Date  Dementia (HCC)   Hypertension  Past Surgical History: Past Surgical History: Procedure Laterality Date  NO PAST SURGERIES   Alessandra Bevels Mattia 11/03/2023, 3:18 PM Electronically Signed   By: Agustin Cree M.D.   On: 11/03/2023 14:37   Labs No results found for this or any previous visit (from the past 2160 hour(s)).  Assessment/Plan:  Swelling of limb Recommend:  I have had a long discussion with the patient regarding swelling and why it  causes symptoms.  Patient will begin wearing graduated compression on a daily basis a prescription was given. The patient will  wear the stockings first thing in the morning and removing them in the evening. The patient is instructed specifically not to sleep in the stockings.   In addition, behavioral modification will be initiated.  This will include frequent elevation, use of over the counter pain medications and exercise such as walking.  Consideration for a lymph pump will also be made based upon the effectiveness of conservative therapy.  This would help to improve the edema control and prevent sequela such as ulcers and infections   Patient should undergo duplex ultrasound of the venous system to ensure that DVT or reflux is not present.  The patient will follow-up with me after the ultrasound.   Extremity cyanosis The patient is immobile with dependent legs and has purplish discoloration.  He certainly is at risk of atherosclerotic peripheral arterial disease and is reasonable to assess ABIs.  He is more likely to have venous disease and I have given him a prescription for some compression  socks and asked them to try to elevate his legs is much as possible.  Will obtain a venous reflux study in the near future at his convenience.  Dementia due to Parkinson's disease without behavioral disturbance (HCC) Quite progressed.  The patient is immobile.      Festus Barren 11/29/2023, 10:40 AM   This note was created with Dragon medical transcription system.  Any errors from dictation are unintentional.    Addendum. We were able to get him in to get his ABIs done today which showed normal triphasic waveforms with ABIs of greater than 1.2 bilaterally consistent with no arterial insufficiency.  The cyanosis is due to inactivity and dependency largely.

## 2023-11-29 NOTE — Assessment & Plan Note (Signed)

## 2023-12-28 DEATH — deceased
